# Patient Record
Sex: Male | Born: 1935 | Race: White | Hispanic: No | Marital: Married
Health system: Southern US, Community
[De-identification: ages and names within clinical notes are randomized; demographics above are authoritative.]

---

## 2005-01-11 ENCOUNTER — Emergency Department: Payer: Self-pay | Admitting: Emergency Medicine

## 2006-09-20 ENCOUNTER — Encounter: Payer: Self-pay | Admitting: Family Medicine

## 2006-10-08 ENCOUNTER — Encounter: Payer: Self-pay | Admitting: Family Medicine

## 2009-02-17 ENCOUNTER — Ambulatory Visit: Payer: Self-pay | Admitting: Urology

## 2009-10-28 ENCOUNTER — Encounter: Payer: Self-pay | Admitting: Family Medicine

## 2009-11-06 ENCOUNTER — Encounter: Payer: Self-pay | Admitting: Family Medicine

## 2009-12-10 ENCOUNTER — Encounter: Payer: Self-pay | Admitting: Family Medicine

## 2010-01-05 ENCOUNTER — Inpatient Hospital Stay: Payer: Self-pay | Admitting: Surgery

## 2010-01-13 ENCOUNTER — Ambulatory Visit: Payer: Self-pay | Admitting: Internal Medicine

## 2010-01-14 ENCOUNTER — Encounter: Payer: Self-pay | Admitting: Family Medicine

## 2010-01-20 ENCOUNTER — Encounter: Payer: Self-pay | Admitting: Internal Medicine

## 2010-02-06 ENCOUNTER — Encounter: Payer: Self-pay | Admitting: Internal Medicine

## 2010-04-26 ENCOUNTER — Emergency Department: Payer: Self-pay | Admitting: Unknown Physician Specialty

## 2010-08-02 ENCOUNTER — Emergency Department: Payer: Self-pay | Admitting: Emergency Medicine

## 2010-10-17 ENCOUNTER — Emergency Department: Payer: Self-pay | Admitting: Emergency Medicine

## 2011-05-12 ENCOUNTER — Emergency Department: Payer: Self-pay | Admitting: Emergency Medicine

## 2011-05-12 LAB — CBC
HCT: 38.4 % — ABNORMAL LOW (ref 40.0–52.0)
HGB: 12.7 g/dL — ABNORMAL LOW (ref 13.0–18.0)
MCH: 30.6 pg (ref 26.0–34.0)
MCHC: 33.1 g/dL (ref 32.0–36.0)
WBC: 6.8 10*3/uL (ref 3.8–10.6)

## 2011-05-12 LAB — COMPREHENSIVE METABOLIC PANEL
Alkaline Phosphatase: 64 U/L (ref 50–136)
Anion Gap: 12 (ref 7–16)
Bilirubin,Total: 0.6 mg/dL (ref 0.2–1.0)
Calcium, Total: 9 mg/dL (ref 8.5–10.1)
Chloride: 107 mmol/L (ref 98–107)
Co2: 28 mmol/L (ref 21–32)
Creatinine: 0.52 mg/dL — ABNORMAL LOW (ref 0.60–1.30)
EGFR (African American): >60
EGFR (Non-African Amer.): >60
Osmolality: 291 (ref 275–301)
Sodium: 147 mmol/L — ABNORMAL HIGH (ref 136–145)

## 2011-05-12 LAB — URINALYSIS, COMPLETE
Bilirubin,UR: NEGATIVE
Glucose,UR: NEGATIVE mg/dL (ref 0–75)
RBC,UR: 25 /HPF (ref 0–5)
Specific Gravity: 1.014 (ref 1.003–1.030)
Squamous Epithelial: 10
WBC UR: 414 /HPF (ref 0–5)

## 2011-07-19 ENCOUNTER — Inpatient Hospital Stay: Payer: Self-pay | Admitting: Internal Medicine

## 2011-07-19 LAB — CBC WITH DIFFERENTIAL/PLATELET
Basophil %: 0.6 %
Eosinophil #: 0.1 10*3/uL (ref 0.0–0.7)
Eosinophil %: 1 %
HGB: 11.9 g/dL — ABNORMAL LOW (ref 13.0–18.0)
Lymphocyte #: 0.6 10*3/uL — ABNORMAL LOW (ref 1.0–3.6)
MCH: 32 pg (ref 26.0–34.0)
MCHC: 33.4 g/dL (ref 32.0–36.0)
Monocyte #: 1 10*3/uL — ABNORMAL HIGH (ref 0.0–0.7)
Neutrophil #: 5.4 10*3/uL (ref 1.4–6.5)
Neutrophil %: 75.6 %

## 2011-07-19 LAB — BASIC METABOLIC PANEL
Anion Gap: 12 (ref 7–16)
BUN: 13 mg/dL (ref 7–18)
Calcium, Total: 8.9 mg/dL (ref 8.5–10.1)
Chloride: 102 mmol/L (ref 98–107)
Co2: 24 mmol/L (ref 21–32)
Creatinine: 0.5 mg/dL — ABNORMAL LOW (ref 0.60–1.30)
EGFR (African American): >60
Glucose: 108 mg/dL — ABNORMAL HIGH (ref 65–99)
Potassium: 3.7 mmol/L (ref 3.5–5.1)
Sodium: 138 mmol/L (ref 136–145)

## 2011-07-20 ENCOUNTER — Ambulatory Visit: Payer: Self-pay | Admitting: Internal Medicine

## 2011-07-20 DIAGNOSIS — I959 Hypotension, unspecified: Secondary | ICD-10-CM

## 2011-07-20 DIAGNOSIS — R748 Abnormal levels of other serum enzymes: Secondary | ICD-10-CM

## 2011-07-20 LAB — URINALYSIS, COMPLETE
Glucose,UR: NEGATIVE mg/dL (ref 0–75)
Nitrite: NEGATIVE
Protein: 30
RBC,UR: 15 /HPF (ref 0–5)
Specific Gravity: 1.019 (ref 1.003–1.030)
WBC UR: 91 /HPF (ref 0–5)

## 2011-07-20 LAB — TSH: Thyroid Stimulating Horm: 0.287 u[IU]/mL — ABNORMAL LOW

## 2011-07-20 LAB — BASIC METABOLIC PANEL
Anion Gap: 12 (ref 7–16)
Calcium, Total: 8.3 mg/dL — ABNORMAL LOW (ref 8.5–10.1)
Chloride: 106 mmol/L (ref 98–107)
Co2: 25 mmol/L (ref 21–32)
Creatinine: 0.55 mg/dL — ABNORMAL LOW (ref 0.60–1.30)
EGFR (African American): >60
EGFR (Non-African Amer.): >60
Osmolality: 285 (ref 275–301)

## 2011-07-20 LAB — TROPONIN I
Troponin-I: 0.09 ng/mL — ABNORMAL HIGH
Troponin-I: 0.09 ng/mL — ABNORMAL HIGH

## 2011-07-20 LAB — CBC WITH DIFFERENTIAL/PLATELET
Basophil #: 0 10*3/uL (ref 0.0–0.1)
Lymphocyte #: 1.5 10*3/uL (ref 1.0–3.6)
Lymphocyte %: 13.2 %
Monocyte %: 10.7 %
Neutrophil %: 75 %
Platelet: 143 10*3/uL — ABNORMAL LOW (ref 150–440)
RDW: 15.4 % — ABNORMAL HIGH (ref 11.5–14.5)
WBC: 11.6 10*3/uL — ABNORMAL HIGH (ref 3.8–10.6)

## 2011-07-22 LAB — WOUND AEROBIC CULTURE

## 2011-07-22 LAB — URINE CULTURE

## 2011-07-24 LAB — CREATININE, SERUM
Creatinine: 0.56 mg/dL — ABNORMAL LOW (ref 0.60–1.30)
EGFR (African American): >60
EGFR (Non-African Amer.): >60

## 2011-07-25 LAB — CULTURE, BLOOD (SINGLE)

## 2011-07-26 LAB — CBC WITH DIFFERENTIAL/PLATELET
Basophil %: 0.6 %
Eosinophil #: 0.7 10*3/uL (ref 0.0–0.7)
Eosinophil %: 13.1 %
HCT: 30.6 % — ABNORMAL LOW (ref 40.0–52.0)
HGB: 10 g/dL — ABNORMAL LOW (ref 13.0–18.0)
Lymphocyte #: 1.5 10*3/uL (ref 1.0–3.6)
Lymphocyte %: 27.8 %
MCH: 31.5 pg (ref 26.0–34.0)
MCHC: 32.7 g/dL (ref 32.0–36.0)
Monocyte %: 12.2 %
Neutrophil #: 2.5 10*3/uL (ref 1.4–6.5)
WBC: 5.5 10*3/uL (ref 3.8–10.6)

## 2011-07-27 LAB — OCCULT BLOOD X 1 CARD TO LAB, STOOL: Occult Blood, Feces: NEGATIVE

## 2011-07-27 LAB — TSH: Thyroid Stimulating Horm: 1.86 u[IU]/mL

## 2011-07-27 LAB — T4, FREE: Free Thyroxine: 0.95 ng/dL (ref 0.76–1.46)

## 2011-07-28 LAB — CREATININE, SERUM: EGFR (Non-African Amer.): >60

## 2011-08-05 ENCOUNTER — Inpatient Hospital Stay: Payer: Self-pay | Admitting: Internal Medicine

## 2011-08-05 LAB — COMPREHENSIVE METABOLIC PANEL
Alkaline Phosphatase: 57 U/L (ref 50–136)
Anion Gap: 7 (ref 7–16)
Bilirubin,Total: 0.3 mg/dL (ref 0.2–1.0)
Calcium, Total: 8.7 mg/dL (ref 8.5–10.1)
Chloride: 105 mmol/L (ref 98–107)
Creatinine: 0.68 mg/dL (ref 0.60–1.30)
EGFR (African American): >60
EGFR (Non-African Amer.): >60
Glucose: 94 mg/dL (ref 65–99)
Potassium: 4.1 mmol/L (ref 3.5–5.1)
SGOT(AST): 16 U/L (ref 15–37)

## 2011-08-05 LAB — CBC
HCT: 32.2 % — ABNORMAL LOW (ref 40.0–52.0)
MCH: 31.5 pg (ref 26.0–34.0)
MCHC: 33 g/dL (ref 32.0–36.0)
RBC: 3.37 10*6/uL — ABNORMAL LOW (ref 4.40–5.90)
WBC: 7.5 10*3/uL (ref 3.8–10.6)

## 2011-08-05 LAB — URINALYSIS, COMPLETE
Glucose,UR: NEGATIVE mg/dL (ref 0–75)
Ketone: NEGATIVE
Nitrite: POSITIVE
Ph: 5 (ref 4.5–8.0)
Protein: 30
RBC,UR: 3320 /HPF (ref 0–5)
Squamous Epithelial: NONE SEEN
WBC UR: 54 /HPF (ref 0–5)

## 2011-08-06 ENCOUNTER — Ambulatory Visit: Payer: Self-pay | Admitting: Urology

## 2011-08-06 LAB — CBC WITH DIFFERENTIAL/PLATELET
Basophil #: 0.1 10*3/uL (ref 0.0–0.1)
Basophil %: 0.8 %
Eosinophil #: 0.8 10*3/uL — ABNORMAL HIGH (ref 0.0–0.7)
Eosinophil %: 10.7 %
HCT: 29.6 % — ABNORMAL LOW (ref 40.0–52.0)
Lymphocyte #: 1.7 10*3/uL (ref 1.0–3.6)
Lymphocyte %: 21.5 %
MCH: 31.7 pg (ref 26.0–34.0)
MCHC: 33.3 g/dL (ref 32.0–36.0)
Monocyte %: 10.8 %
RBC: 3.12 10*6/uL — ABNORMAL LOW (ref 4.40–5.90)
RDW: 14.6 % — ABNORMAL HIGH (ref 11.5–14.5)
WBC: 7.9 10*3/uL (ref 3.8–10.6)

## 2011-08-06 LAB — BASIC METABOLIC PANEL
Anion Gap: 8 (ref 7–16)
BUN: 17 mg/dL (ref 7–18)
Chloride: 111 mmol/L — ABNORMAL HIGH (ref 98–107)
Co2: 25 mmol/L (ref 21–32)
EGFR (Non-African Amer.): >60
Glucose: 102 mg/dL — ABNORMAL HIGH (ref 65–99)
Potassium: 3.8 mmol/L (ref 3.5–5.1)
Sodium: 144 mmol/L (ref 136–145)

## 2011-08-06 LAB — CK TOTAL AND CKMB (NOT AT ARMC)
CK, Total: 25 U/L — ABNORMAL LOW (ref 35–232)
CK-MB: 0.8 ng/mL (ref 0.5–3.6)

## 2011-08-06 LAB — TROPONIN I
Troponin-I: <0.02 ng/mL
Troponin-I: <0.02 ng/mL

## 2011-08-07 LAB — MAGNESIUM: Magnesium: 1.5 mg/dL — ABNORMAL LOW

## 2011-08-07 LAB — BASIC METABOLIC PANEL
BUN: 10 mg/dL (ref 7–18)
Calcium, Total: 8 mg/dL — ABNORMAL LOW (ref 8.5–10.1)
Co2: 26 mmol/L (ref 21–32)
Creatinine: 0.52 mg/dL — ABNORMAL LOW (ref 0.60–1.30)
Osmolality: 292 (ref 275–301)
Sodium: 147 mmol/L — ABNORMAL HIGH (ref 136–145)

## 2011-08-07 LAB — HEMOGLOBIN: HGB: 10.3 g/dL — ABNORMAL LOW (ref 13.0–18.0)

## 2011-08-08 ENCOUNTER — Ambulatory Visit: Payer: Self-pay | Admitting: Internal Medicine

## 2011-08-08 LAB — BASIC METABOLIC PANEL
Anion Gap: 8 (ref 7–16)
Calcium, Total: 8.2 mg/dL — ABNORMAL LOW (ref 8.5–10.1)
EGFR (African American): >60
Glucose: 127 mg/dL — ABNORMAL HIGH (ref 65–99)
Potassium: 4 mmol/L (ref 3.5–5.1)
Sodium: 144 mmol/L (ref 136–145)

## 2011-08-08 LAB — URINE CULTURE

## 2011-08-08 LAB — TSH: Thyroid Stimulating Horm: 3.95 u[IU]/mL

## 2011-08-08 LAB — MAGNESIUM: Magnesium: 1.9 mg/dL

## 2011-08-09 LAB — BASIC METABOLIC PANEL
Calcium, Total: 8.1 mg/dL — ABNORMAL LOW (ref 8.5–10.1)
Co2: 27 mmol/L (ref 21–32)
Creatinine: 0.55 mg/dL — ABNORMAL LOW (ref 0.60–1.30)
EGFR (Non-African Amer.): >60
Osmolality: 290 (ref 275–301)
Potassium: 4.1 mmol/L (ref 3.5–5.1)
Sodium: 146 mmol/L — ABNORMAL HIGH (ref 136–145)

## 2011-08-09 LAB — CBC WITH DIFFERENTIAL/PLATELET
Basophil #: 0.1 10*3/uL (ref 0.0–0.1)
Eosinophil %: 14.8 %
HGB: 10 g/dL — ABNORMAL LOW (ref 13.0–18.0)
Lymphocyte #: 1.9 10*3/uL (ref 1.0–3.6)
MCH: 31.6 pg (ref 26.0–34.0)
MCHC: 33.2 g/dL (ref 32.0–36.0)
MCV: 95 fL (ref 80–100)
Monocyte #: 0.9 10*3/uL — ABNORMAL HIGH (ref 0.0–0.7)
Monocyte %: 11.7 %
Neutrophil #: 3.5 10*3/uL (ref 1.4–6.5)
Platelet: 229 10*3/uL (ref 150–440)
RBC: 3.17 10*6/uL — ABNORMAL LOW (ref 4.40–5.90)
RDW: 15 % — ABNORMAL HIGH (ref 11.5–14.5)

## 2011-08-11 DIAGNOSIS — I059 Rheumatic mitral valve disease, unspecified: Secondary | ICD-10-CM

## 2011-08-11 DIAGNOSIS — I498 Other specified cardiac arrhythmias: Secondary | ICD-10-CM

## 2011-08-11 DIAGNOSIS — I959 Hypotension, unspecified: Secondary | ICD-10-CM

## 2011-08-11 LAB — BASIC METABOLIC PANEL
Anion Gap: 8 (ref 7–16)
BUN: 16 mg/dL (ref 7–18)
Calcium, Total: 8.9 mg/dL (ref 8.5–10.1)
Chloride: 109 mmol/L — ABNORMAL HIGH (ref 98–107)
Co2: 28 mmol/L (ref 21–32)
Creatinine: 0.47 mg/dL — ABNORMAL LOW (ref 0.60–1.30)
EGFR (Non-African Amer.): >60
Osmolality: 291 (ref 275–301)
Sodium: 145 mmol/L (ref 136–145)

## 2011-08-11 LAB — CULTURE, BLOOD (SINGLE)

## 2011-08-11 LAB — HEMOGLOBIN: HGB: 11.1 g/dL — ABNORMAL LOW (ref 13.0–18.0)

## 2011-08-13 LAB — URINALYSIS, COMPLETE
Bilirubin,UR: NEGATIVE
Glucose,UR: NEGATIVE mg/dL (ref 0–75)
Nitrite: NEGATIVE
Specific Gravity: 1.002 (ref 1.003–1.030)
Squamous Epithelial: NONE SEEN
WBC UR: 60 /HPF (ref 0–5)

## 2011-08-17 LAB — CBC WITH DIFFERENTIAL/PLATELET
Eosinophil %: 0.1 %
HGB: 9.5 g/dL — ABNORMAL LOW (ref 13.0–18.0)
Lymphocyte #: 0.8 10*3/uL — ABNORMAL LOW (ref 1.0–3.6)
MCH: 31.2 pg (ref 26.0–34.0)
MCHC: 32.6 g/dL (ref 32.0–36.0)
Monocyte #: 0.4 x10 3/mm (ref 0.2–1.0)
Monocyte %: 5 %
Neutrophil #: 6.4 10*3/uL (ref 1.4–6.5)
Neutrophil %: 84.4 %
Platelet: 164 10*3/uL (ref 150–440)
RBC: 3.06 10*6/uL — ABNORMAL LOW (ref 4.40–5.90)
WBC: 7.6 10*3/uL (ref 3.8–10.6)

## 2011-08-18 LAB — BASIC METABOLIC PANEL
Co2: 26 mmol/L (ref 21–32)
Creatinine: 0.57 mg/dL — ABNORMAL LOW (ref 0.60–1.30)
EGFR (African American): >60
EGFR (Non-African Amer.): >60
Glucose: 113 mg/dL — ABNORMAL HIGH (ref 65–99)
Potassium: 3.5 mmol/L (ref 3.5–5.1)

## 2011-08-19 LAB — BASIC METABOLIC PANEL
Anion Gap: 8 (ref 7–16)
Co2: 28 mmol/L (ref 21–32)
Creatinine: 0.5 mg/dL — ABNORMAL LOW (ref 0.60–1.30)
EGFR (African American): >60
EGFR (Non-African Amer.): >60
Potassium: 3.8 mmol/L (ref 3.5–5.1)
Sodium: 141 mmol/L (ref 136–145)

## 2011-10-23 ENCOUNTER — Emergency Department: Payer: Self-pay | Admitting: Emergency Medicine

## 2011-10-23 LAB — URINALYSIS, COMPLETE
Bilirubin,UR: NEGATIVE
Nitrite: NEGATIVE
Ph: 8 (ref 4.5–8.0)
Protein: 100
Specific Gravity: 1.009 (ref 1.003–1.030)
WBC UR: 1710 /HPF (ref 0–5)

## 2011-10-25 LAB — URINE CULTURE

## 2011-12-17 ENCOUNTER — Emergency Department: Payer: Self-pay | Admitting: Emergency Medicine

## 2011-12-17 LAB — COMPREHENSIVE METABOLIC PANEL
Albumin: 2 g/dL — ABNORMAL LOW (ref 3.4–5.0)
Alkaline Phosphatase: 64 U/L (ref 50–136)
BUN: 16 mg/dL (ref 7–18)
Bilirubin,Total: 0.2 mg/dL (ref 0.2–1.0)
Calcium, Total: 8.8 mg/dL (ref 8.5–10.1)
Creatinine: 0.75 mg/dL (ref 0.60–1.30)
Glucose: 152 mg/dL — ABNORMAL HIGH (ref 65–99)
Osmolality: 285 (ref 275–301)
Sodium: 141 mmol/L (ref 136–145)
Total Protein: 7 g/dL (ref 6.4–8.2)

## 2011-12-17 LAB — CBC
HCT: 30.8 % — ABNORMAL LOW (ref 40.0–52.0)
HGB: 10.3 g/dL — ABNORMAL LOW (ref 13.0–18.0)
MCH: 29.1 pg (ref 26.0–34.0)
MCV: 87 fL (ref 80–100)
Platelet: 341 10*3/uL (ref 150–440)
RBC: 3.54 10*6/uL — ABNORMAL LOW (ref 4.40–5.90)
WBC: 8.4 10*3/uL (ref 3.8–10.6)

## 2011-12-17 LAB — URINALYSIS, COMPLETE
Bilirubin,UR: NEGATIVE
Glucose,UR: NEGATIVE mg/dL (ref 0–75)
Ketone: NEGATIVE
RBC,UR: 1211 /HPF (ref 0–5)
Specific Gravity: 1.012 (ref 1.003–1.030)
Squamous Epithelial: NONE SEEN
WBC UR: 8841 /HPF (ref 0–5)

## 2011-12-18 LAB — URINE CULTURE

## 2011-12-22 LAB — CULTURE, BLOOD (SINGLE)

## 2012-01-11 ENCOUNTER — Inpatient Hospital Stay: Payer: Self-pay | Admitting: Internal Medicine

## 2012-01-11 LAB — URINALYSIS, COMPLETE
Bilirubin,UR: NEGATIVE
Ketone: NEGATIVE
Nitrite: NEGATIVE
Ph: 5 (ref 4.5–8.0)
Protein: 30
RBC,UR: 729 /HPF (ref 0–5)
Specific Gravity: 1.02 (ref 1.003–1.030)
Squamous Epithelial: NONE SEEN
WBC UR: 11343 /HPF (ref 0–5)

## 2012-01-11 LAB — COMPREHENSIVE METABOLIC PANEL
Albumin: 1.6 g/dL — ABNORMAL LOW (ref 3.4–5.0)
BUN: 22 mg/dL — ABNORMAL HIGH (ref 7–18)
Bilirubin,Total: 0.3 mg/dL (ref 0.2–1.0)
Calcium, Total: 8.6 mg/dL (ref 8.5–10.1)
Creatinine: 0.65 mg/dL (ref 0.60–1.30)
EGFR (African American): >60
EGFR (Non-African Amer.): >60
Glucose: 102 mg/dL — ABNORMAL HIGH (ref 65–99)
Osmolality: 277 (ref 275–301)
Potassium: 3.4 mmol/L — ABNORMAL LOW (ref 3.5–5.1)
SGOT(AST): 20 U/L (ref 15–37)
Sodium: 137 mmol/L (ref 136–145)
Total Protein: 7.1 g/dL (ref 6.4–8.2)

## 2012-01-11 LAB — CBC
HGB: 8.9 g/dL — ABNORMAL LOW (ref 13.0–18.0)
MCV: 83 fL (ref 80–100)
Platelet: 328 10*3/uL (ref 150–440)
RDW: 15.7 % — ABNORMAL HIGH (ref 11.5–14.5)
WBC: 8.6 10*3/uL (ref 3.8–10.6)

## 2012-01-13 LAB — CBC WITH DIFFERENTIAL/PLATELET
Basophil #: 0 10*3/uL (ref 0.0–0.1)
Basophil %: 0.6 %
Eosinophil %: 0.3 %
HCT: 24.4 % — ABNORMAL LOW (ref 40.0–52.0)
Lymphocyte #: 1 10*3/uL (ref 1.0–3.6)
Lymphocyte %: 19.1 %
MCH: 25.6 pg — ABNORMAL LOW (ref 26.0–34.0)
MCV: 83 fL (ref 80–100)
Monocyte #: 0.3 x10 3/mm (ref 0.2–1.0)
Monocyte %: 6.4 %
Platelet: 296 10*3/uL (ref 150–440)
RDW: 15.7 % — ABNORMAL HIGH (ref 11.5–14.5)
WBC: 5.2 10*3/uL (ref 3.8–10.6)

## 2012-01-13 LAB — BASIC METABOLIC PANEL
BUN: 18 mg/dL (ref 7–18)
Chloride: 102 mmol/L (ref 98–107)
Creatinine: 0.74 mg/dL (ref 0.60–1.30)
Osmolality: 277 (ref 275–301)
Potassium: 3.2 mmol/L — ABNORMAL LOW (ref 3.5–5.1)
Sodium: 136 mmol/L (ref 136–145)

## 2012-01-13 LAB — FERRITIN: Ferritin (ARMC): 819 ng/mL — ABNORMAL HIGH (ref 8–388)

## 2012-01-13 LAB — IRON AND TIBC
Iron Bind.Cap.(Total): 120 ug/dL — ABNORMAL LOW (ref 250–450)
Iron: 47 ug/dL — ABNORMAL LOW (ref 65–175)

## 2012-01-14 LAB — BASIC METABOLIC PANEL
Anion Gap: 8 (ref 7–16)
BUN: 16 mg/dL (ref 7–18)
Calcium, Total: 8.8 mg/dL (ref 8.5–10.1)
Creatinine: 0.73 mg/dL (ref 0.60–1.30)
EGFR (African American): >60
EGFR (Non-African Amer.): >60
Glucose: 138 mg/dL — ABNORMAL HIGH (ref 65–99)
Sodium: 135 mmol/L — ABNORMAL LOW (ref 136–145)

## 2012-01-14 LAB — CBC WITH DIFFERENTIAL/PLATELET
Basophil #: 0 10*3/uL (ref 0.0–0.1)
HCT: 25.6 % — ABNORMAL LOW (ref 40.0–52.0)
Lymphocyte %: 13.9 %
Monocyte #: 0.3 x10 3/mm (ref 0.2–1.0)
Monocyte %: 4.1 %
Neutrophil #: 5.8 10*3/uL (ref 1.4–6.5)
Platelet: 323 10*3/uL (ref 150–440)
RDW: 15.6 % — ABNORMAL HIGH (ref 11.5–14.5)
WBC: 7.1 10*3/uL (ref 3.8–10.6)

## 2012-01-17 LAB — CULTURE, BLOOD (SINGLE)

## 2012-01-23 LAB — URINALYSIS, COMPLETE
Bilirubin,UR: NEGATIVE
Ketone: NEGATIVE
Ph: 5 (ref 4.5–8.0)
Protein: 100
Specific Gravity: 1.02 (ref 1.003–1.030)
Squamous Epithelial: NONE SEEN

## 2012-01-23 LAB — CBC
HCT: 26.1 % — ABNORMAL LOW (ref 40.0–52.0)
MCH: 28 pg (ref 26.0–34.0)
MCV: 84 fL (ref 80–100)
RBC: 3.11 10*6/uL — ABNORMAL LOW (ref 4.40–5.90)
RDW: 17.1 % — ABNORMAL HIGH (ref 11.5–14.5)
WBC: 9.7 10*3/uL (ref 3.8–10.6)

## 2012-01-23 LAB — BASIC METABOLIC PANEL
BUN: 22 mg/dL — ABNORMAL HIGH (ref 7–18)
Calcium, Total: 8.5 mg/dL (ref 8.5–10.1)
Co2: 27 mmol/L (ref 21–32)
Creatinine: 0.61 mg/dL (ref 0.60–1.30)
EGFR (African American): >60
EGFR (Non-African Amer.): >60
Glucose: 120 mg/dL — ABNORMAL HIGH (ref 65–99)
Potassium: 3.7 mmol/L (ref 3.5–5.1)
Sodium: 138 mmol/L (ref 136–145)

## 2012-01-24 ENCOUNTER — Observation Stay: Payer: Self-pay | Admitting: Internal Medicine

## 2012-01-25 LAB — BASIC METABOLIC PANEL
Anion Gap: 7 (ref 7–16)
BUN: 19 mg/dL — ABNORMAL HIGH (ref 7–18)
Chloride: 105 mmol/L (ref 98–107)
Co2: 25 mmol/L (ref 21–32)
Creatinine: 0.67 mg/dL (ref 0.60–1.30)
EGFR (Non-African Amer.): >60
Glucose: 158 mg/dL — ABNORMAL HIGH (ref 65–99)
Osmolality: 279 (ref 275–301)

## 2012-01-29 LAB — URINE CULTURE

## 2012-02-11 ENCOUNTER — Inpatient Hospital Stay: Payer: Self-pay | Admitting: Internal Medicine

## 2012-02-11 LAB — CBC WITH DIFFERENTIAL/PLATELET
Basophil #: 0 10*3/uL (ref 0.0–0.1)
Basophil %: 0.2 %
Eosinophil #: 0 10*3/uL (ref 0.0–0.7)
Eosinophil %: 0.1 %
HCT: 37.6 % — ABNORMAL LOW (ref 40.0–52.0)
HGB: 12.4 g/dL — ABNORMAL LOW (ref 13.0–18.0)
Lymphocyte %: 4.4 %
MCH: 28.5 pg (ref 26.0–34.0)
MCHC: 32.9 g/dL (ref 32.0–36.0)
MCV: 87 fL (ref 80–100)
Monocyte %: 3.4 %
Neutrophil #: 12.3 10*3/uL — ABNORMAL HIGH (ref 1.4–6.5)
Neutrophil %: 91.9 %
Platelet: 233 10*3/uL (ref 150–440)
RDW: 24.1 % — ABNORMAL HIGH (ref 11.5–14.5)
WBC: 13.4 10*3/uL — ABNORMAL HIGH (ref 3.8–10.6)

## 2012-02-11 LAB — COMPREHENSIVE METABOLIC PANEL
Albumin: 2.1 g/dL — ABNORMAL LOW (ref 3.4–5.0)
Anion Gap: 12 (ref 7–16)
BUN: 22 mg/dL — ABNORMAL HIGH (ref 7–18)
Bilirubin,Total: 0.5 mg/dL (ref 0.2–1.0)
Chloride: 103 mmol/L (ref 98–107)
Co2: 29 mmol/L (ref 21–32)
EGFR (African American): >60
EGFR (Non-African Amer.): >60
Glucose: 138 mg/dL — ABNORMAL HIGH (ref 65–99)
Potassium: 3.4 mmol/L — ABNORMAL LOW (ref 3.5–5.1)
SGOT(AST): 35 U/L (ref 15–37)
SGPT (ALT): 45 U/L (ref 12–78)
Sodium: 144 mmol/L (ref 136–145)
Total Protein: 5.9 g/dL — ABNORMAL LOW (ref 6.4–8.2)

## 2012-02-11 LAB — URINALYSIS, COMPLETE
Bilirubin,UR: NEGATIVE
Glucose,UR: NEGATIVE mg/dL (ref 0–75)
Ketone: NEGATIVE
Nitrite: NEGATIVE
Ph: 8 (ref 4.5–8.0)
Squamous Epithelial: NONE SEEN
WBC UR: 1181 /HPF (ref 0–5)

## 2012-02-11 LAB — PRO B NATRIURETIC PEPTIDE: B-Type Natriuretic Peptide: 5112 pg/mL — ABNORMAL HIGH (ref 0–450)

## 2012-02-11 LAB — LIPASE, BLOOD: Lipase: 69 U/L — ABNORMAL LOW (ref 73–393)

## 2012-02-11 LAB — APTT: Activated PTT: <23 secs — ABNORMAL LOW (ref 23.6–35.9)

## 2012-02-11 LAB — PROTIME-INR
INR: 1
Prothrombin Time: 13.6 secs (ref 11.5–14.7)

## 2012-02-11 LAB — MAGNESIUM: Magnesium: 1.7 mg/dL — ABNORMAL LOW

## 2012-02-11 LAB — TROPONIN I: Troponin-I: 0.18 ng/mL — ABNORMAL HIGH

## 2012-02-12 LAB — CBC WITH DIFFERENTIAL/PLATELET
Basophil #: 0 10*3/uL (ref 0.0–0.1)
Basophil %: 0.1 %
Eosinophil %: 0.6 %
HCT: 31.4 % — ABNORMAL LOW (ref 40.0–52.0)
HGB: 10.4 g/dL — ABNORMAL LOW (ref 13.0–18.0)
Lymphocyte #: 1.3 10*3/uL (ref 1.0–3.6)
Lymphocyte %: 14.1 %
MCH: 28.8 pg (ref 26.0–34.0)
MCV: 87 fL (ref 80–100)
Monocyte #: 0.4 x10 3/mm (ref 0.2–1.0)
Monocyte %: 4.4 %
Neutrophil #: 7.2 10*3/uL — ABNORMAL HIGH (ref 1.4–6.5)
RDW: 24.6 % — ABNORMAL HIGH (ref 11.5–14.5)
WBC: 8.9 10*3/uL (ref 3.8–10.6)

## 2012-02-12 LAB — BASIC METABOLIC PANEL
Anion Gap: 9 (ref 7–16)
Chloride: 106 mmol/L (ref 98–107)
Co2: 33 mmol/L — ABNORMAL HIGH (ref 21–32)
Creatinine: 0.44 mg/dL — ABNORMAL LOW (ref 0.60–1.30)
EGFR (African American): >60
EGFR (Non-African Amer.): >60
Osmolality: 295 (ref 275–301)
Potassium: 3.3 mmol/L — ABNORMAL LOW (ref 3.5–5.1)

## 2012-02-13 LAB — MAGNESIUM: Magnesium: 2 mg/dL

## 2012-02-17 LAB — CULTURE, BLOOD (SINGLE)

## 2012-02-17 LAB — URINE CULTURE

## 2012-03-09 DEATH — deceased

## 2012-10-16 IMAGING — CR DG CHEST 1V PORT
1 series · 1 of 1 positions shown · non-contrast
Comparison: none

REASON FOR EXAM: hypotension
COMMENTS:

[portable]
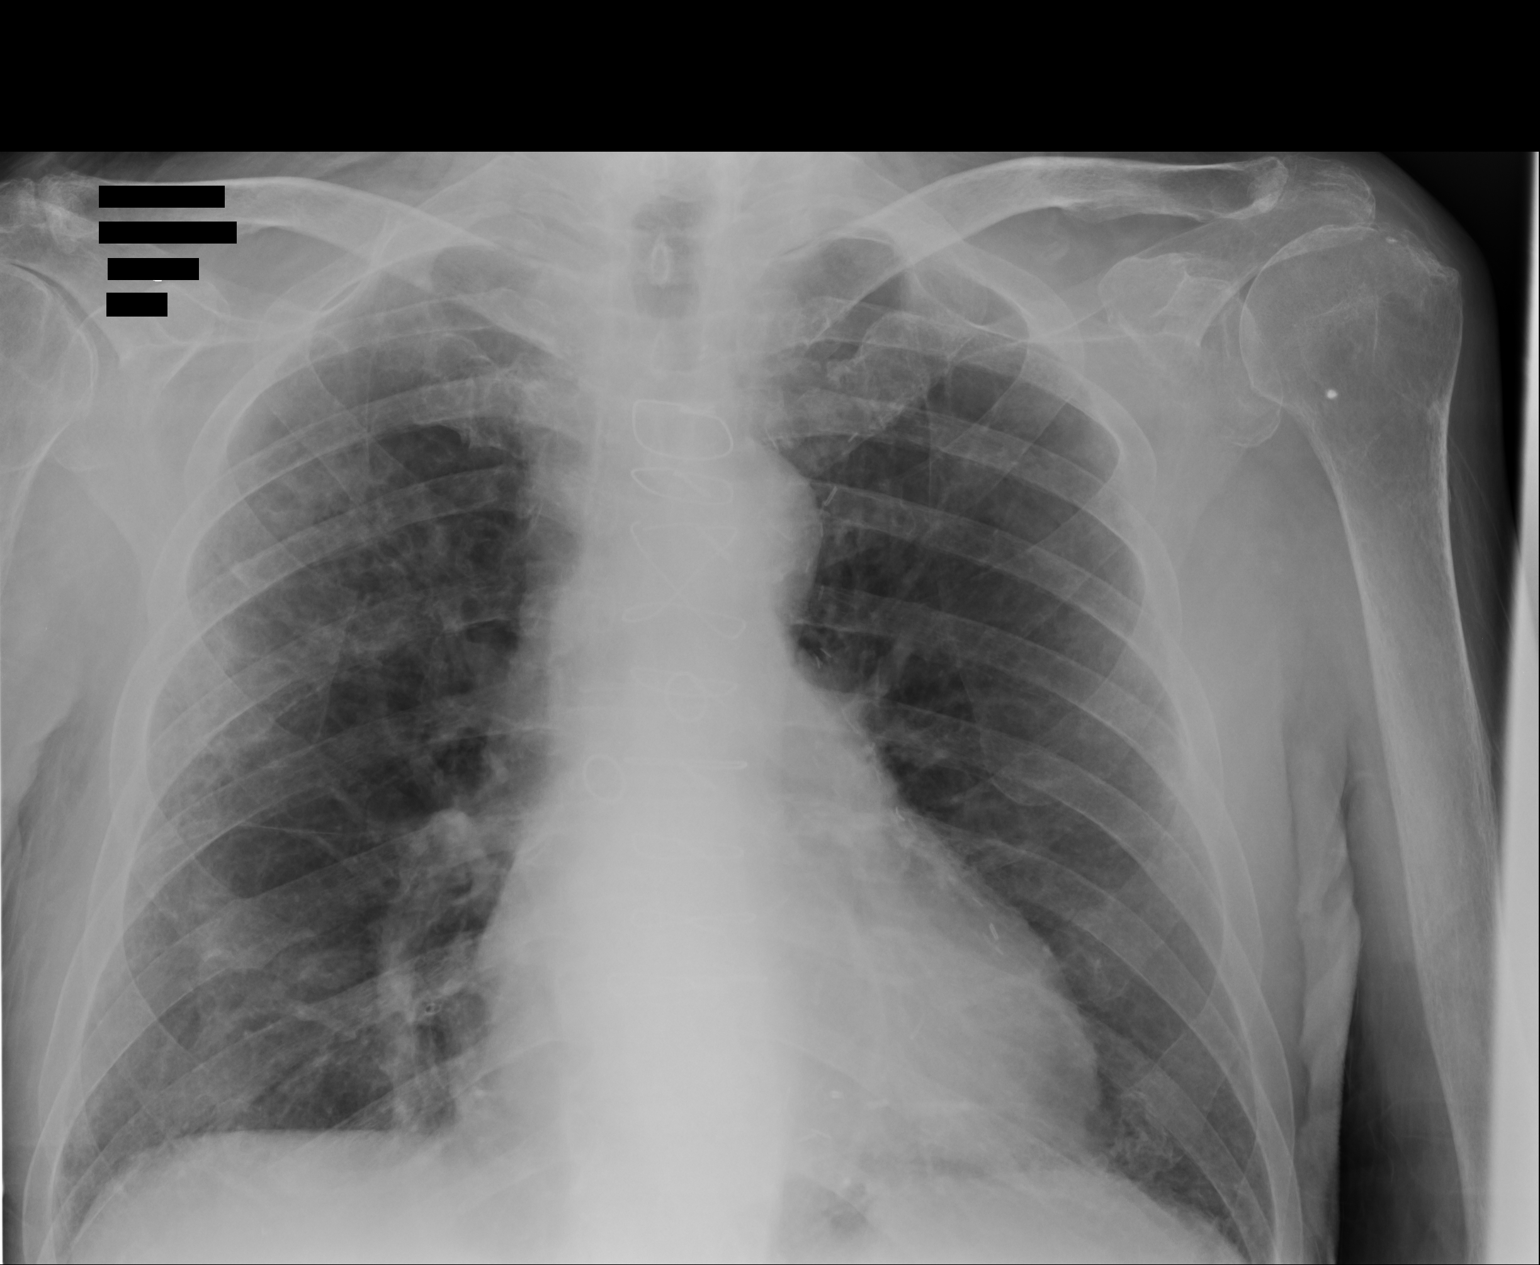

[1 of 1 positions shown; findings below may reference images not displayed]

PROCEDURE:     DXR - DXR PORTABLE CHEST SINGLE VIEW  - August 05, 2011  [DATE]

RESULT:     Comparison is made to the study 05 January, 2010.

Patchy density in the right upper lobes is consistent with pneumonia.
Minimal right lung base involvement is not excluded. The heart is normal.
The left lung is clear. There is mild hyperinflation. There is no
pneumothorax. No significant effusion is evident.
IMPRESSION: 1. Right upper lobe pneumonia with minimal right lung base involvement.
Follow up to exclude progression and to document complete resolution is
recommended.

## 2013-03-24 IMAGING — CR DG CHEST 2V
1 series · 3 of 3 positions shown · non-contrast
Comparison: none

REASON FOR EXAM: weakness
COMMENTS:

[Series 1: x chest ap · 0.14mm/px · 3 of 3 slices shown]
[im 1/3]
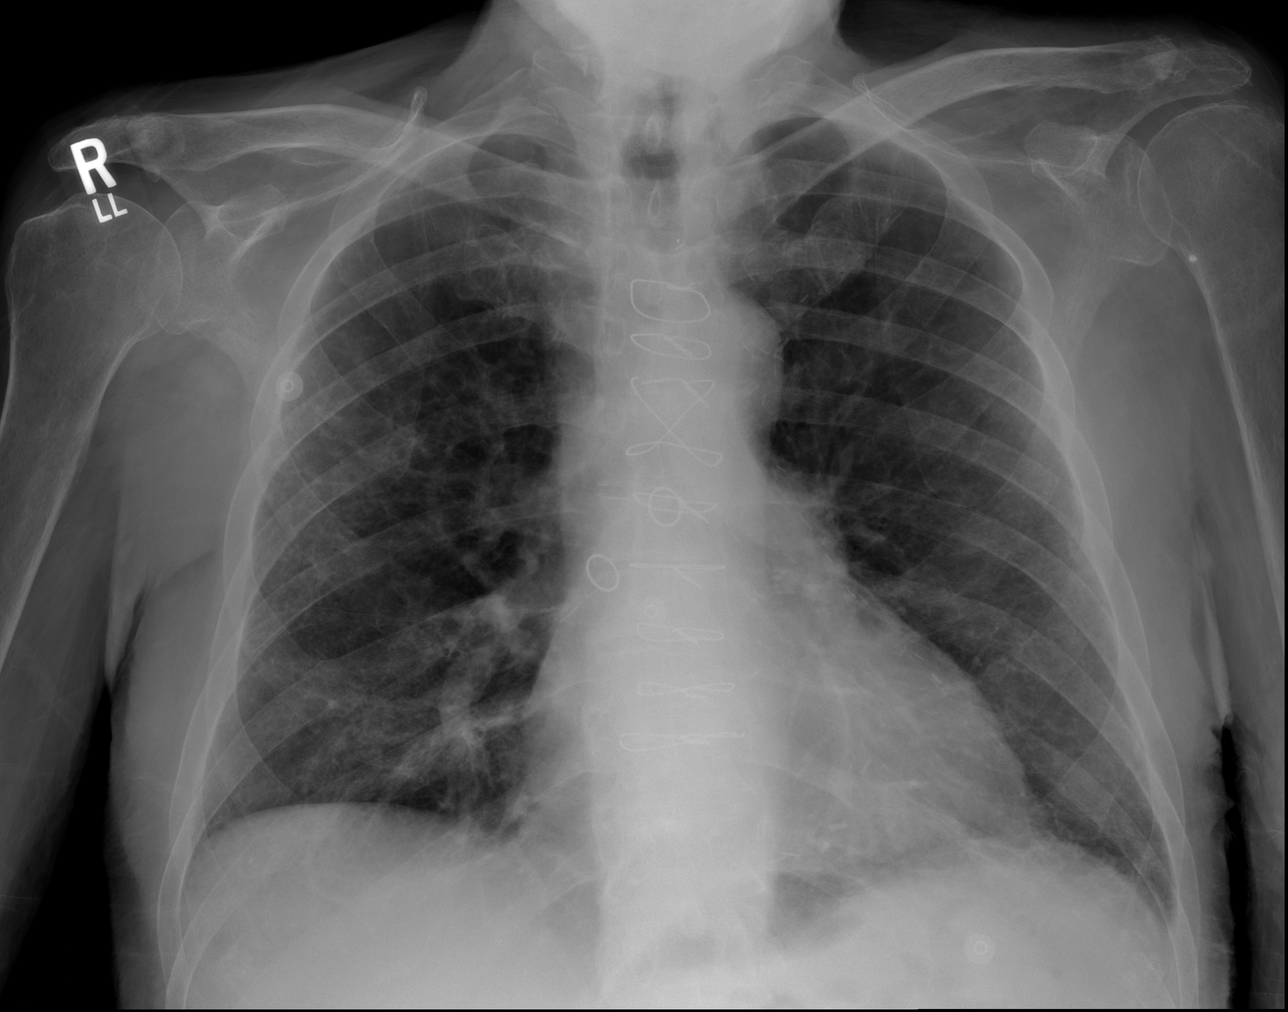
[im 2/3]
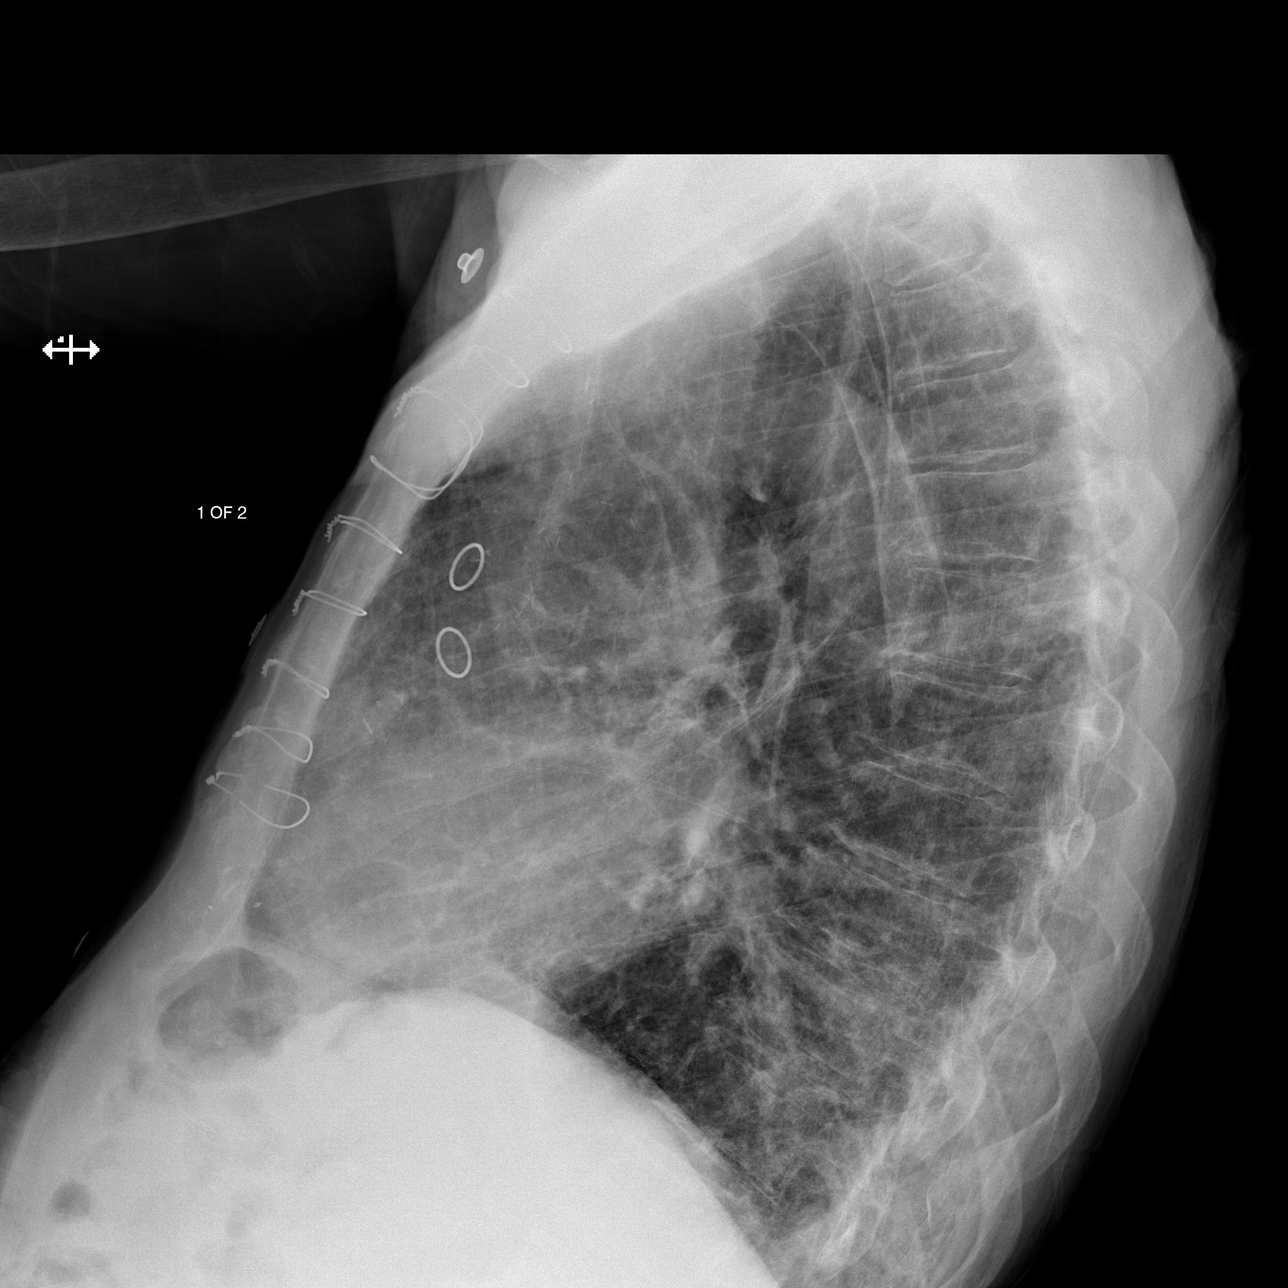
[im 3/3]
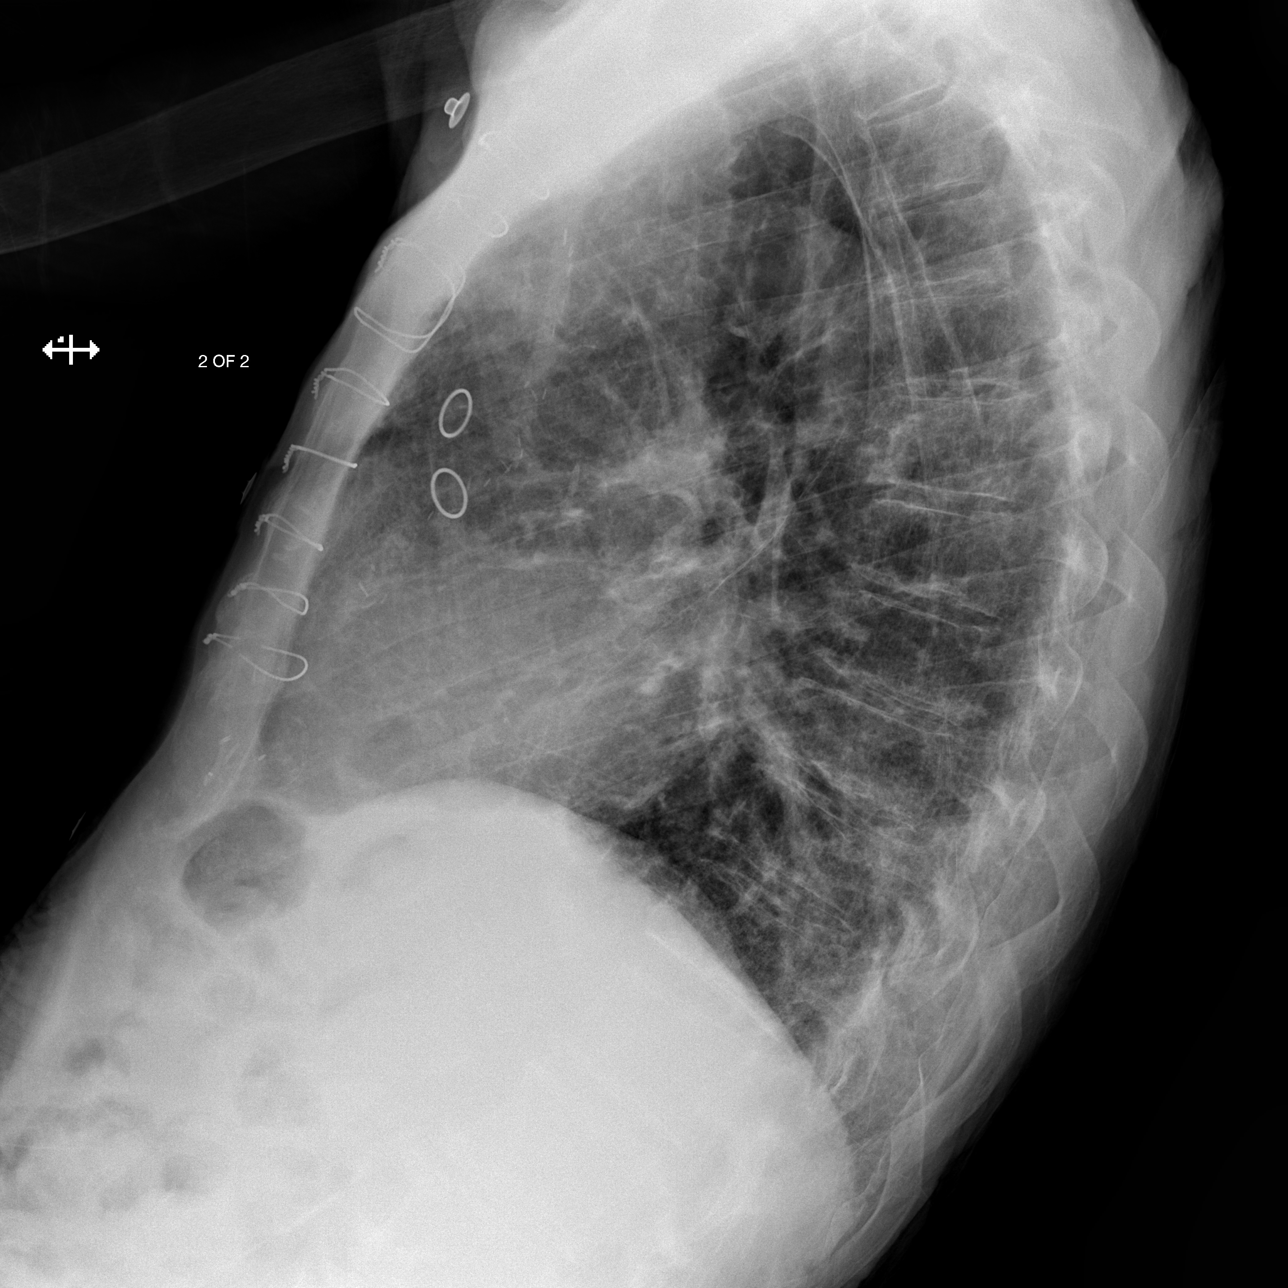

[3 of 3 positions shown; findings below may reference images not displayed]

PROCEDURE:     DXR - DXR CHEST PA (OR AP) AND LATERAL  - January 11, 2012  [DATE]

RESULT:     Comparison is made to the portable study 07 August, 2011.

The lungs are adequately inflated. There is no focal infiltrate. The
interstitial markings of both lungs are increased and have become more
conspicuous since the earlier study. Confluent density in the retrocardiac
region may the bilateral. The patient has undergone previous CABG. The
pulmonary vascularity is not clearly engorged.
IMPRESSION: There are increased interstitial markings in both lungs
especially inferiorly on the left but fairly diffusely on the right. These
findings are not entirely new. I cannot exclude superimposed subsegmental
atelectasis or mild interstitial edema with there being underlying chronic
pulmonary fibrotic change. If the patient's symptoms warrant further
evaluation, followup chest CT scanning would be a useful next step.

[REDACTED]

## 2013-04-24 IMAGING — CT CT HEAD WITHOUT CONTRAST
3 of 4 series · 18 of 30 positions shown, 20 images · non-contrast
Comparison: none

REASON FOR EXAM: seizure
COMMENTS:

PROCEDURE:     CT  - CT HEAD WITHOUT CONTRAST  - February 11, 2012  [DATE]
RESULT:     History: Seizure.
Comparison Study: Prior CT of 07/19/2011.

[Series 2: without · axial · non-contrast · 0.43mm/px · z∈[+172,+282]mm · 7 of 30 slices shown, 9 images (1 of 2)]
[im 4/30  brain]
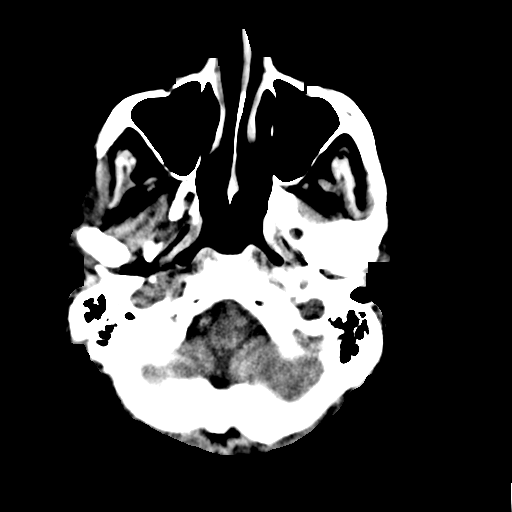
[im 4/30  bone]
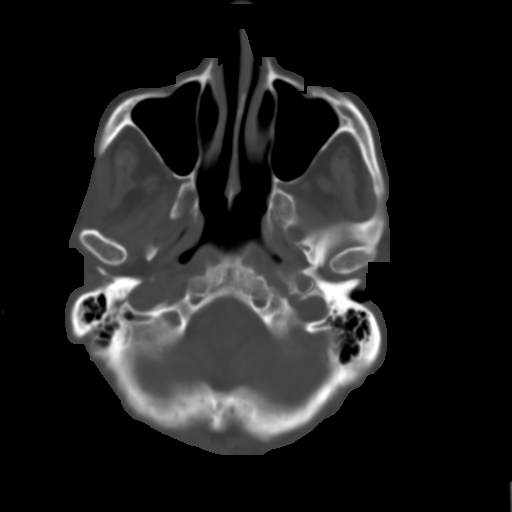
[im 8/30  brain]
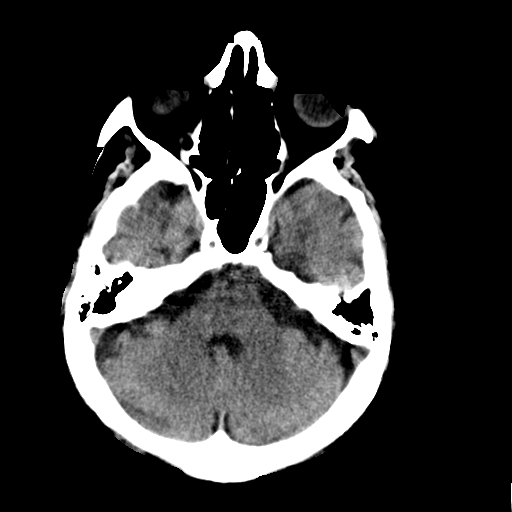
[im 11/30  brain]
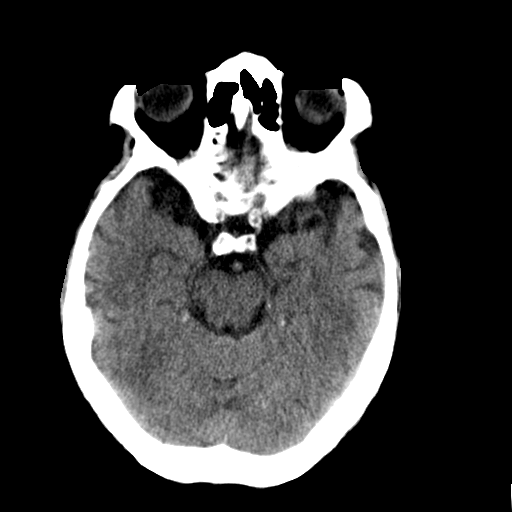
[im 15/30  brain]
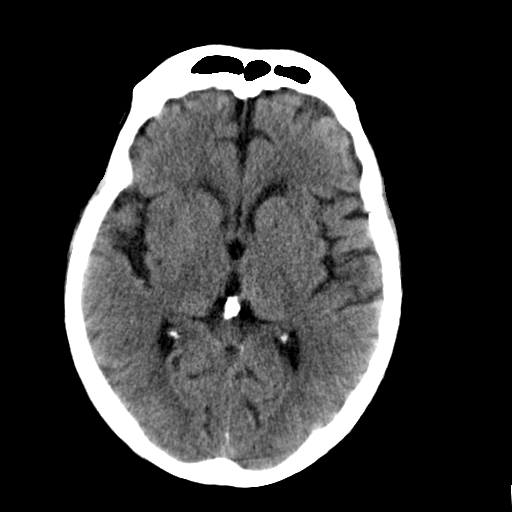
[im 19/30  brain]
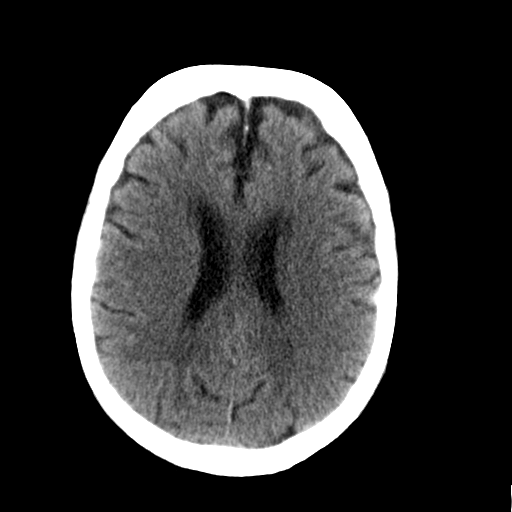
[im 19/30  bone]
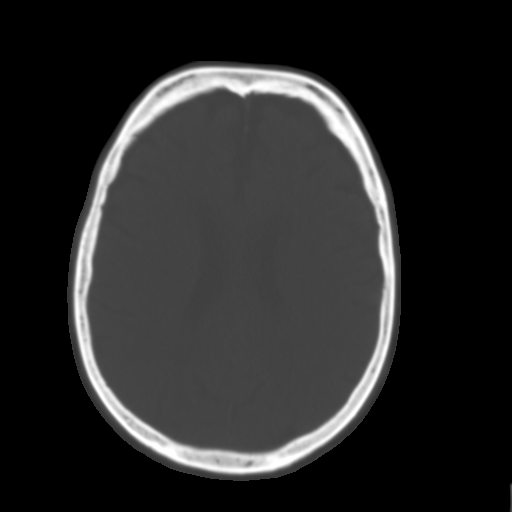
[im 22/30  brain]
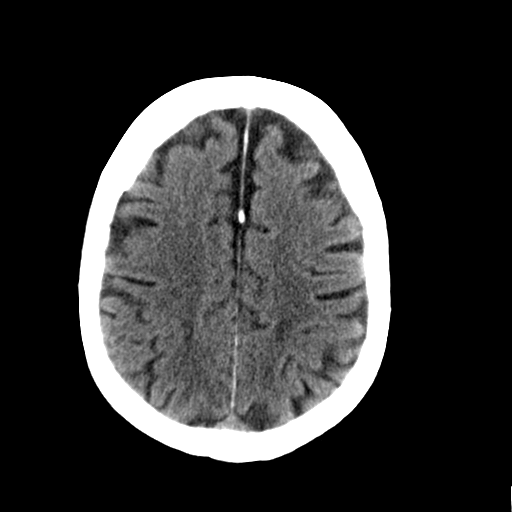
[im 26/30  brain]
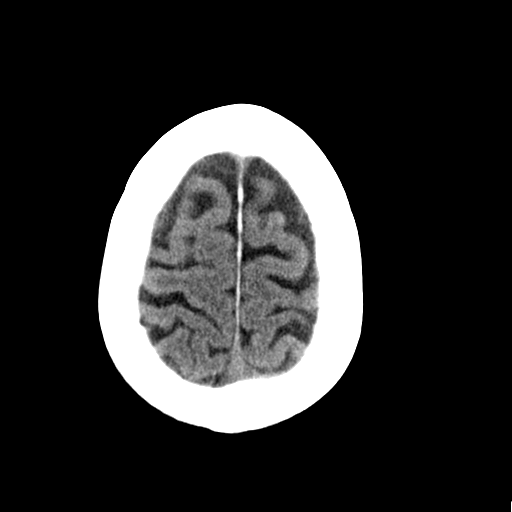

[Series 3: bone · axial · 0.43mm/px · z∈[+172,+282]mm · 7 of 30 slices shown]
[im 4/30  bone]
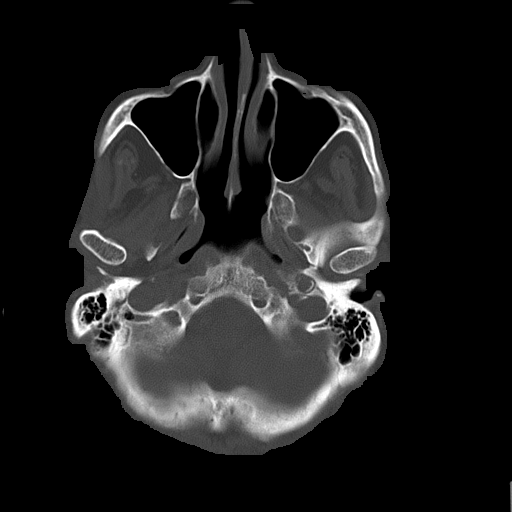
[im 8/30  bone]
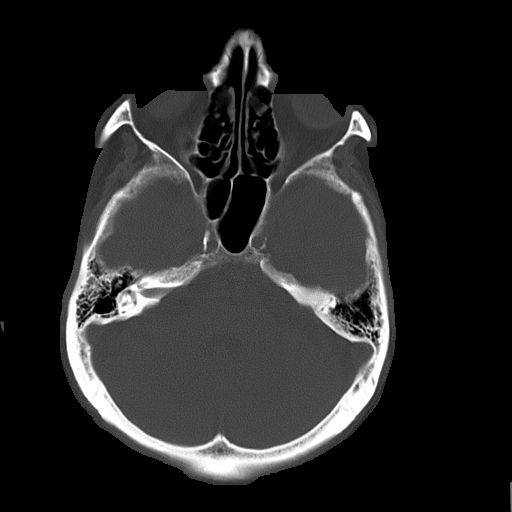
[im 11/30  bone]
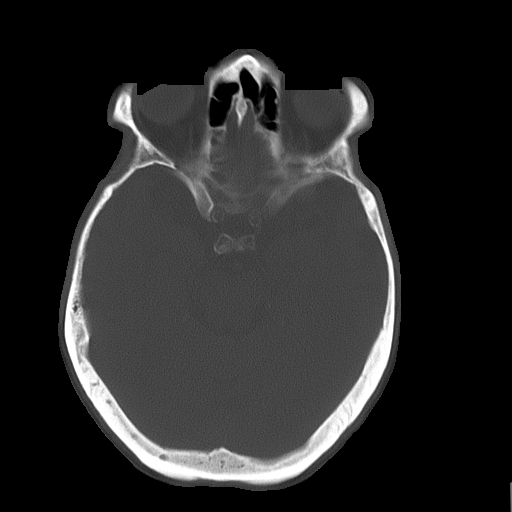
[im 15/30  bone]
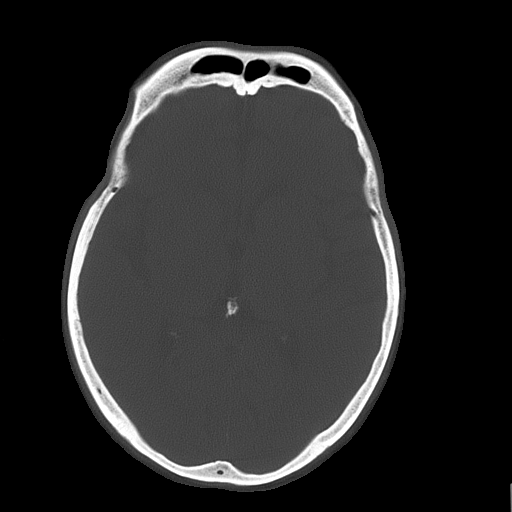
[im 19/30  bone]
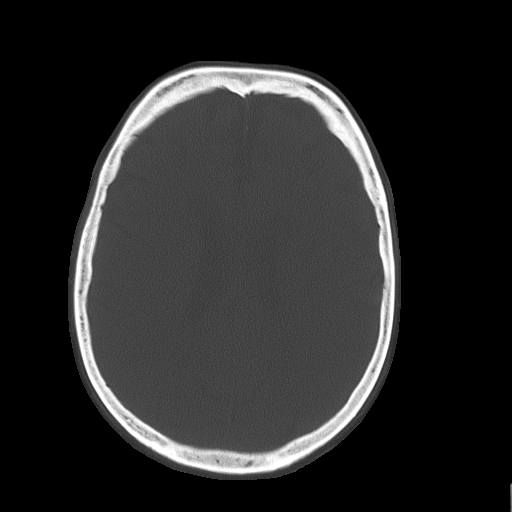
[im 22/30  bone]
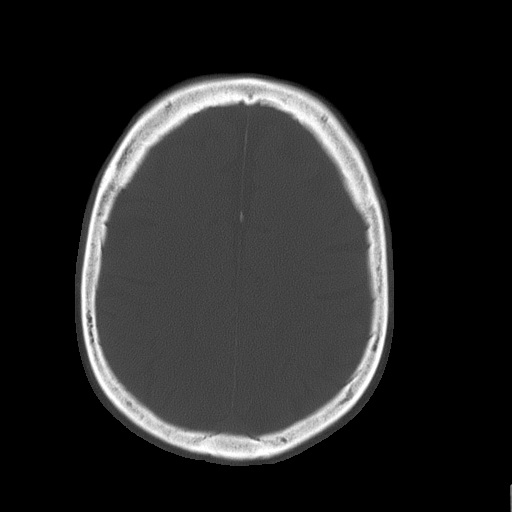
[im 26/30  bone]
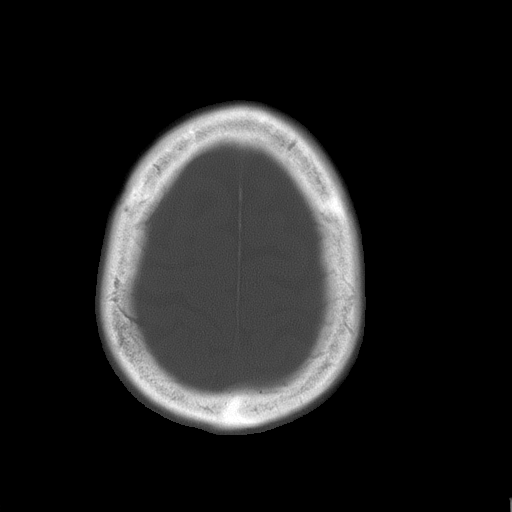

[Series 4: without · axial · non-contrast · 0.43mm/px · z∈[+203,+253]mm · 4 of 18 slices shown (2 of 2)]
[im 4/18  brain]
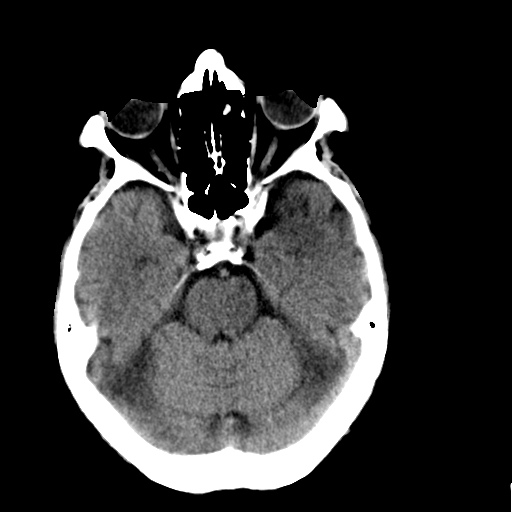
[im 7/18  brain]
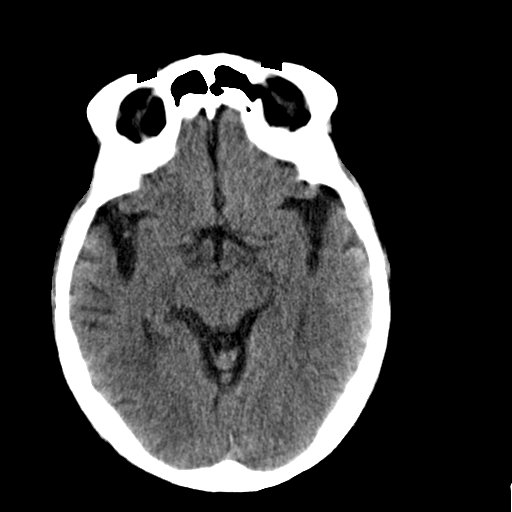
[im 11/18  brain]
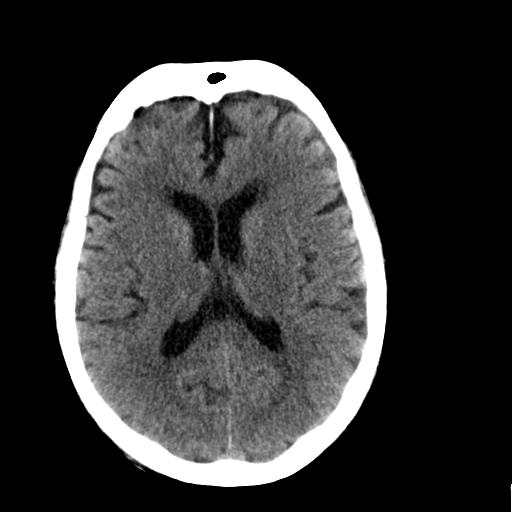
[im 14/18  brain]
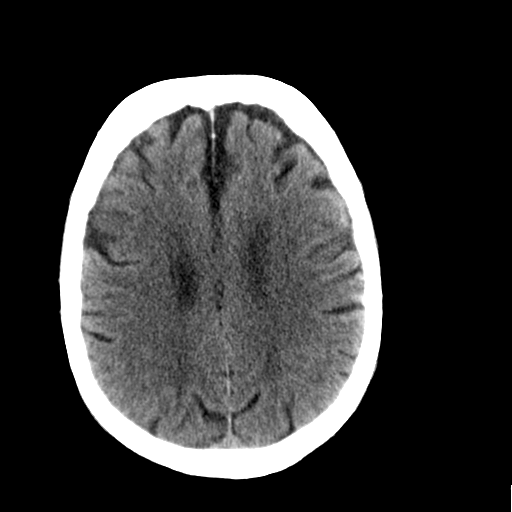

[18 of 30 positions shown; findings below may reference images not displayed]

FINDINGS: No mass. No hydrocephalus. No hemorrhage. No acute bony
abnormality.
IMPRESSION: No acute abnormality.

## 2013-04-24 IMAGING — CT CT ABD-PELV W/O CM
1 of 2 series · 15 of 32 positions shown, 19 images · non-contrast
Comparison: none

REASON FOR EXAM: (1) abd pain; (2) abd pain
COMMENTS:

PROCEDURE:     CT  - CT ABDOMEN AND PELVIS W[DATE]  [DATE]
RESULT:     A history: Pain.

[Series 2: 3mm soft tissue · axial · 0.77mm/px · z∈[-586,-124]mm · 15 of 168 slices shown, 19 images]
[im 7/168  soft-tissue]
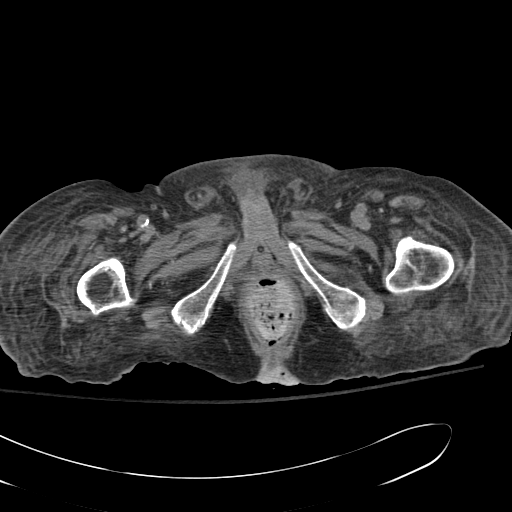
[im 7/168  bone]
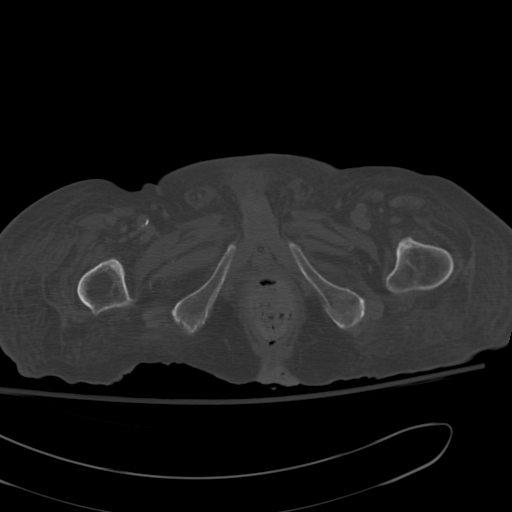
[im 21/168  soft-tissue]
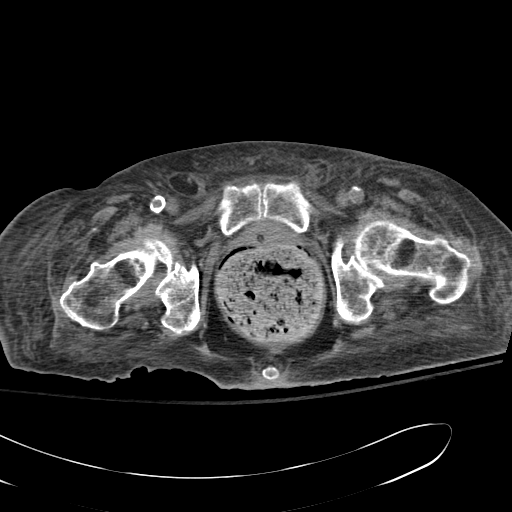
[im 34/168  soft-tissue]
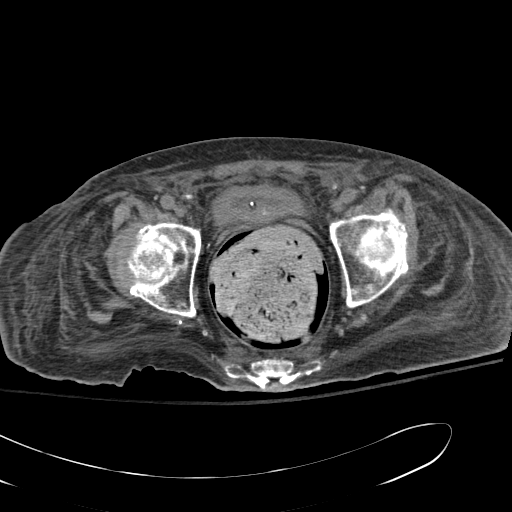
[im 47/168  soft-tissue]
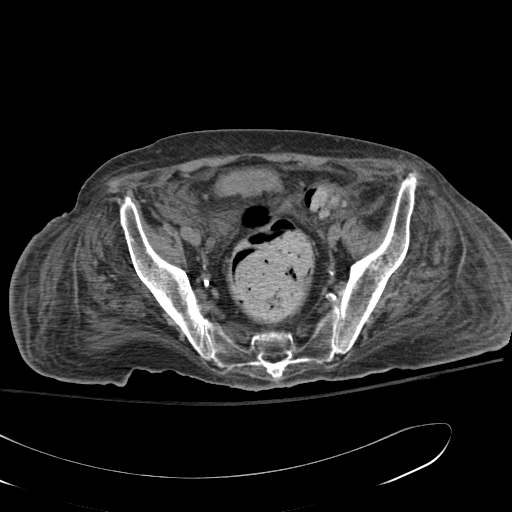
[im 61/168  soft-tissue]
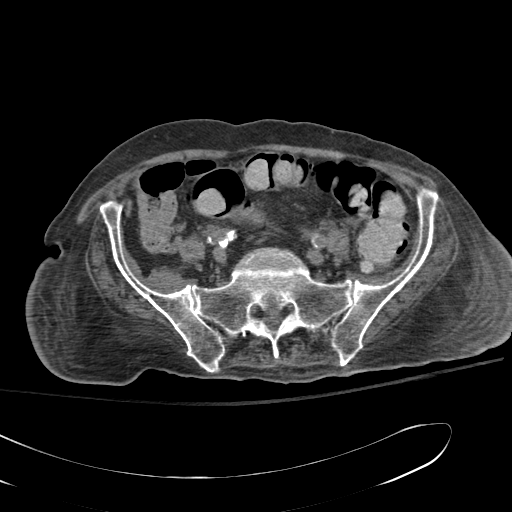
[im 74/168  soft-tissue]
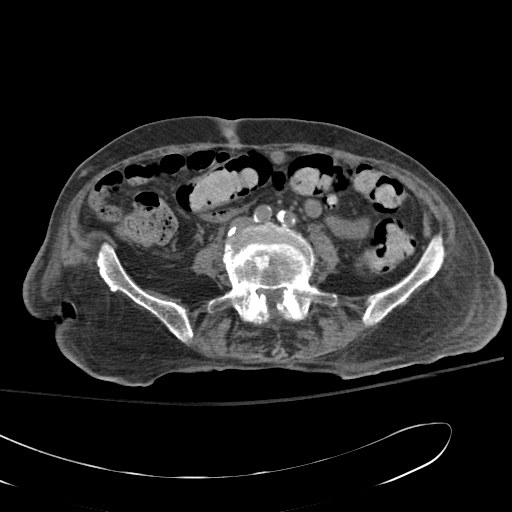
[im 87/168  soft-tissue]
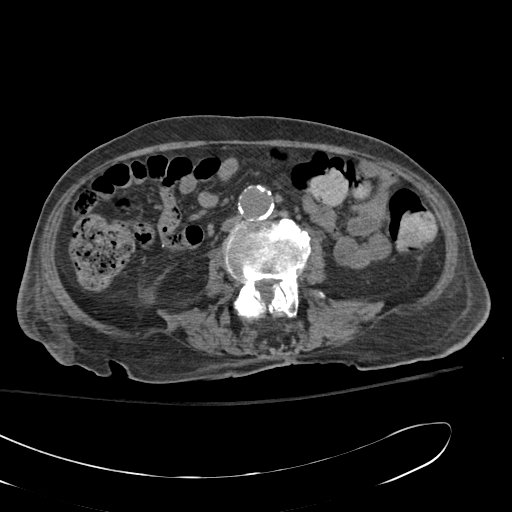
[im 94/168  soft-tissue]
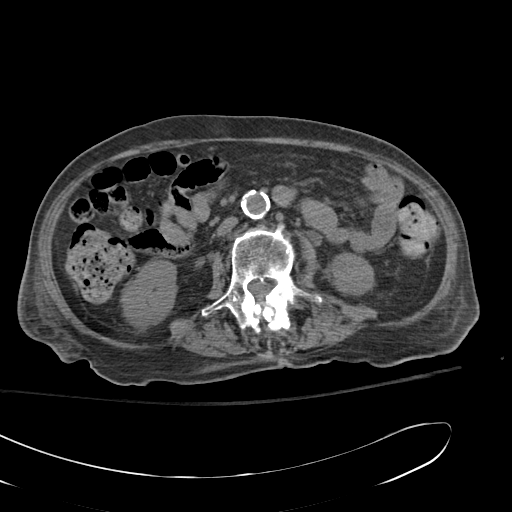
[im 107/168  soft-tissue]
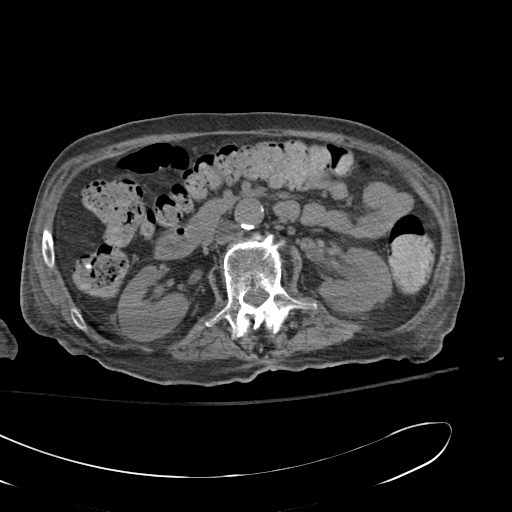
[im 107/168  bone]
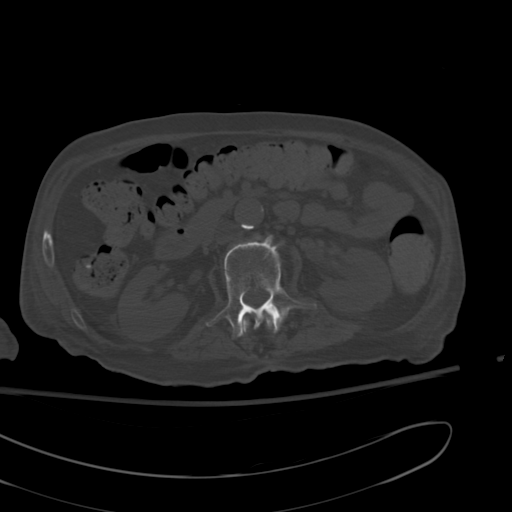
[im 121/168  soft-tissue]
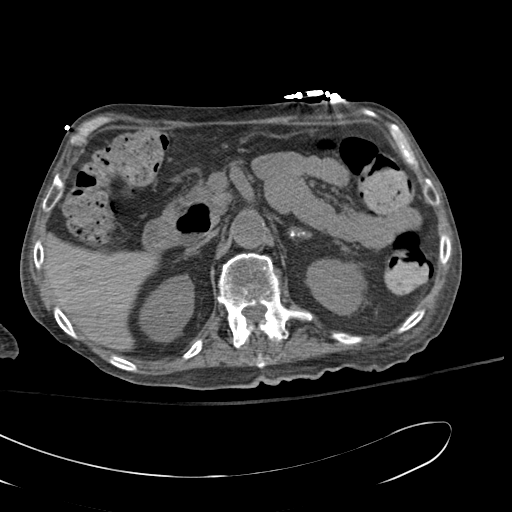
[im 134/168  soft-tissue]
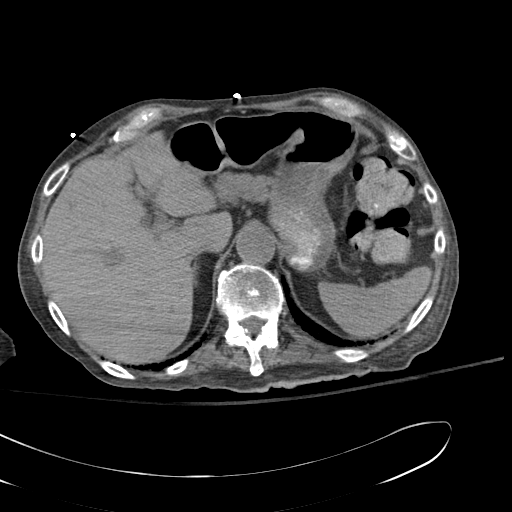
[im 141/168  lung]
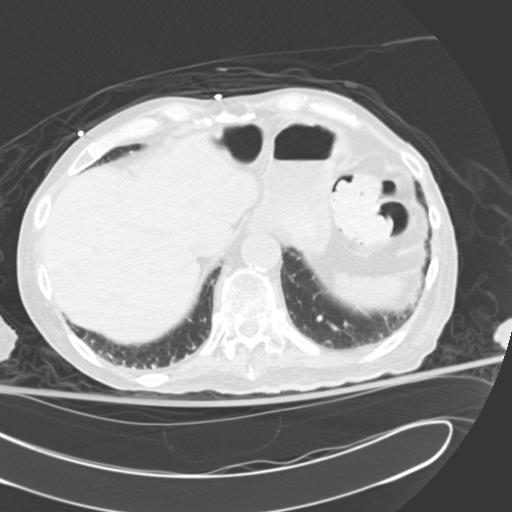
[im 147/168  soft-tissue]
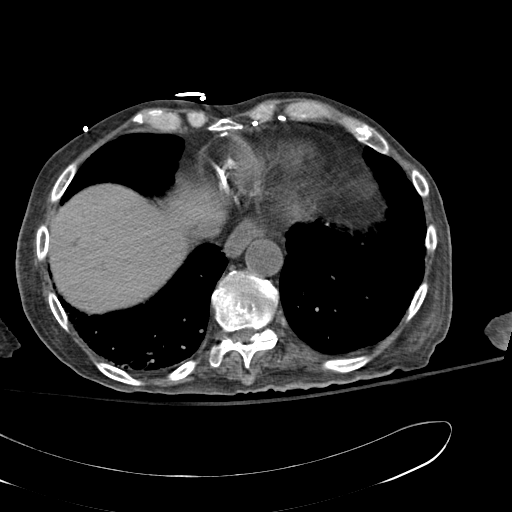
[im 147/168  lung]
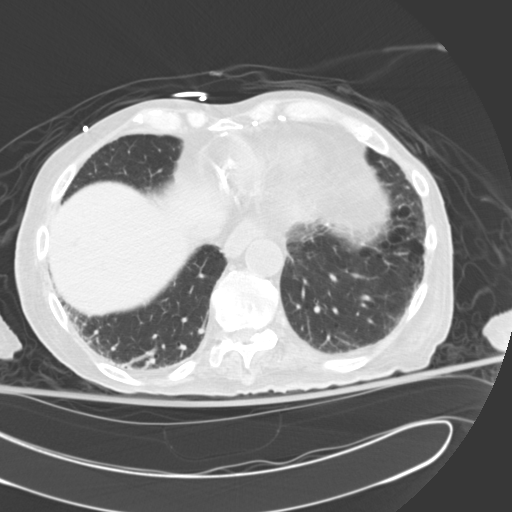
[im 154/168  lung]
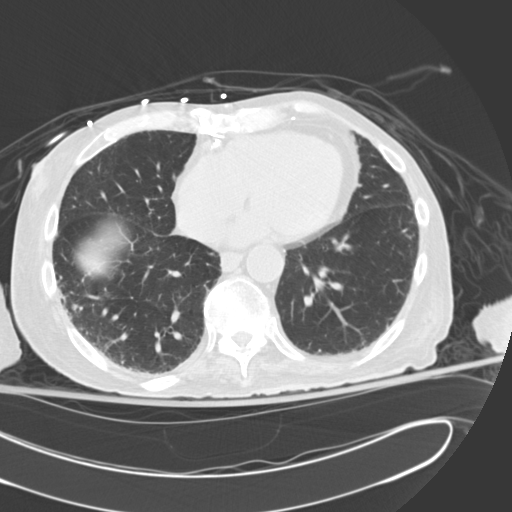
[im 161/168  soft-tissue]
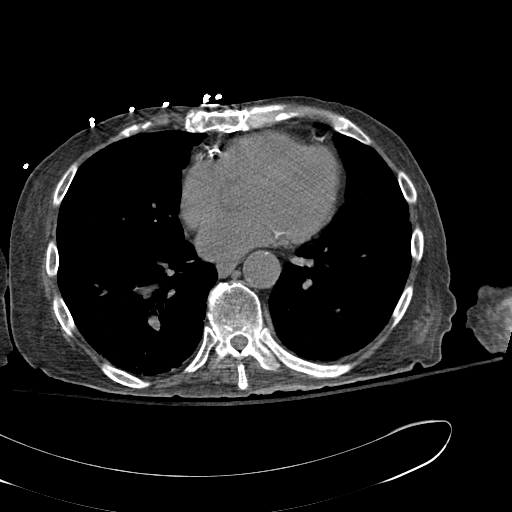
[im 161/168  lung]
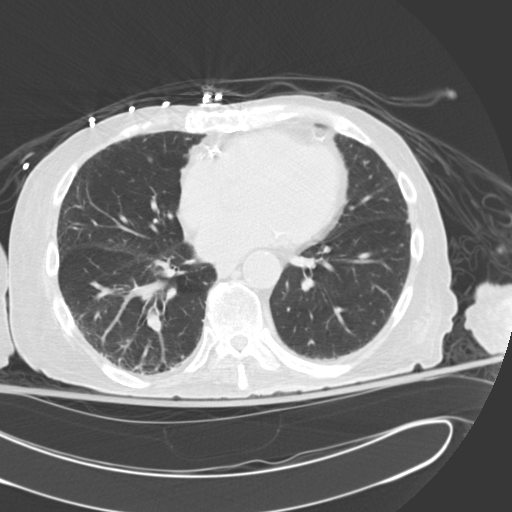

[15 of 32 positions shown; findings below may reference images not displayed]

FINDINGS: Standard nonenhanced CT obtained. Atelectasis and bronchiectasis
lung bases. No free air. Liver and gallbladder are unremarkable. Spleen
unremarkable. Pancreas unremarkable. Calcification in the left adrenal
consistent old insult. Bilateral nephrolithiasis noted. Large amount of
stone debris noted throughout the bladder particularly about the right
posterior lateral bladder at the level ureterovesical junction. Mild
ureteral dilatation. These finds are consistent with recently passed stones.
Bladder is nondistended. Foley catheter is present. Severe fecal impaction
is noted with stool noted throughout the colon particular rectosigmoid.
Abdominal aortic ectasia noted. Diffuse anasarca noted.
IMPRESSION: Severe fecal impaction with extremely large amount of stool
throughout the colon particular the rectosigmoid. Rectosigmoid is very
distended with stool. Disimpaction is suggested. Diffuse anasarca.
Nephrolithiasis. Stones noted in bladder.

## 2014-08-26 NOTE — Discharge Summary (Signed)
PATIENT NAME:  Leonard [name], Leonard [name] MR#:  [id number] DATE OF BIRTH:  03/29/1930  DATE OF ADMISSION:  02/11/2012 DATE OF DISCHARGE:  02/13/2012  PRIMARY CARE PHYSICIAN: [facility]  DISCHARGE DIAGNOSES:   1. Abdominal pain with fecal impaction.  2. Leukocytosis and possible urinary tract infection versus colonization.  3. Elevated troponin.  4. History of cerebrovascular accident.  5. Decubitus ulcers.  6. Adrenal insufficiency.  7. Possible seizure.  8. Hypokalemia.  9. Hypomagnesemia.   MEDICATIONS ON DISCHARGE:  1. Aspirin 81 mg p.o. daily.  2. Hydrocortisone 10 mg, 5 tablets t.i.d.  3. Fludrocortisone 0.1 mg, 3 tablets daily.  4. Percocet 5/325, 1 tablet every 6 hours as needed for pain.  5. Plavix 75 mg daily.  6. Pseudoephedrine 120 mg extended-release every 12 hours.  7. ProAmatine 10 mg t.i.d.  8. Lactulose 10 grams/15 mL, 30 mL b.i.d.  9. MiraLax 17 grams daily as needed for constipation.  10. Senna 1 tablet b.i.d. as needed for constipation.  11. Potassium chloride 20 mEq daily.  12. Magnesium oxide 400 mg b.i.d.   13. Zyvox 600 mg every 12 hours for 5 more days then stop.   TREATMENTS: Foley to leg bag. Change Foley every 3 weeks. Telfa pad covered with Kerlix wraps on bilateral lower extremities. Heel protectors while in bed.   DIET: Regular diet, regular consistency.  ACTIVITY: Activity as tolerated.   REFERRAL:  [facility]. [facility].   2. [facility].   FOLLOW UP:  Follow up in 1 to 2 days with doctor [facility].   CODE STATUS: The patient is a DO NOT RESUSCITATE.   HOSPITAL LABORATORY, DIAGNOSTIC AND RADIOLOGICAL DATA: A urine culture grew out 100,000 unidentified organisms. Urinalysis: 3+ leukocyte esterase, 3+ bacteria. Blood cultures: No growth. INR 1.0. Magnesium 1.7. BNP 5112, troponin 0.18, lipase 69, glucose 138, BUN 22, creatinine 0.7, sodium 144, potassium 3.4, chloride 103, CO2 29, calcium  8.0. Liver function tests: Albumin low at 2.1, total protein low at 5.9. White blood cell count 13.4, hemoglobin and hematocrit 12.4 and 37.6, platelet count of 233. EKG normal sinus rhythm, flattening of T waves laterally. CT scan of the head showed no acute intracranial abnormality. Chest x-ray showed no acute abnormality. CT scan of the abdomen and pelvis without contrast showed severe fecal impaction, an extremely large amount of stool throughout the colon and rectosigmoid, anasarca, nephrolithiasis, stones in the bladder. Troponin trended down to 0.12, magnesium upon discharge 2.0, potassium 3.5. White blood cell count 8.9, hemoglobin and hematocrit 10.4 and 31.4, platelet count of 192   HOSPITAL COURSE: The patient was admitted 02/11/2012, discharged on 02/13/2012. He came in with abdominal pain, possible seizure, and was confused. No further seizures in the ER or over the hospital course. He was admitted with abdominal pain and fecal impaction, started on lactulose. He was given Zyvox for possibility of urinary tract infection. He had elevated troponin, and he was already on aspirin and Plavix. Beta blocker was contraindicated secondary to adrenal insufficiency and hypotension. His potassium and magnesium were replaced.   PROBLEM LIST:  1. Abdominal pain and fecal impaction: The patient has been having bowel movements with the lactulose standing dose. This has been a chronic issue for him. He needs chronic bowel regimen. I will continue lactulose twice a day.  2. Leukocytosis, possible urinary tract infection versus colonization: I changed the Foley catheter. Usually I do not want to treat urinary tract infections  with chronic catheters unless there are signs of sepsis.  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 Need to follow up final urine culture results at the facility. The Foley catheter was changed on this hospital stay and needs to be changed every three weeks.   3. Elevated troponin with no signs of myocardial infarction: No complaints of chest pain. The patient is already on aspirin and Plavix. Unable to give beta blocker secondary to chronic hypotension. Overall prognosis is poor. Recommend conservative management.  4. History of cerebrovascular accident: On aspirin and Plavix.  5. History of decubitus ulcers: Follows up with Wound Care at [facility].  6. Adrenal insufficiency: On numerous medications, hydrocortisone, fludrocortisone pseudoephedrine, and ProAmatine. Blood pressure upon discharge was 128/98.  7. Possible seizure: The patient would not hold still for EEG and that was canceled. No further seizures here. We will hold off on any antiseizure medication.  8. Hypokalemia: This has been replaced orally.  9. Hypomagnesemia: This has been replaced orally.   The case was discussed with [facility] [place]. He is unsafe to go home, was made a DO NOT RESUSCITATE.  I would recommend Hosp[facility] followup at [facility] to avoid hospital readmissions.    TIME SPENT ON DISCHARGE: 40 minutes.  ____________________________ Herschell Dimes. Renae Gloss, MD rjw:cbb D: 02/13/2012 09:35:31 ET T: 02/13/2012 10:03:38 ET JOB#: [id number]  cc: Herschell Dimes. Renae Gloss, MD, <Dictator> [facility] Salley Scarlet MD ELECTRONICALLY SIGNED 02/15/2012 16:58

## 2014-08-26 NOTE — H&P (Signed)
PATIENT NAME:  Leonard [name], FRISCH MR#:  [id number] DATE OF BIRTH:  [birth year]  DATE OF ADMISSION:  01/24/2012  PRIMARY CARE PHYSICIAN:  [facility], [place], [place].   CHIEF COMPLAINT: Groin pain.   HISTORY OF PRESENT ILLNESS:   The patient appears to be a poor historian. No family member present in the Emergency Room. Mr. Leonard [name] is an 81 year old Caucasian gentleman who was most recently admitted 01/11/2012, discharged 01/16/2012. He was admitted at that time with weakness, possible urinary tract infection, discharged home after the patient refused to go to rehab, comes in today after he started having some groin pain. The patient has caregivers during the daytime and in the evening hours, asked them to call the EMS who brought him here. The patient has a chronic Foley catheter secondary to benign prostatic hypertrophy. His catheter was changed during the last admission. His urine has been persistently suggestive of urinary tract infection. The patient currently is afebrile. White count is stable. Internal medicine was consulted for admission and further evaluation. Next, in the ER the patient is hemodynamically stable. Denies any groin pain during my evaluation. He tells me, "now it is gone". The patient also reports he is going to go to "go to the restroom on Thursday". No family member is present in the Emergency Room.   PAST MEDICAL HISTORY:    1. Chronic hypotension with autonomic dysfunction, on Midodrine and Florinef.  2. Benign prostatic opportunity with chronic Foley.  3. Coronary artery disease.  4. Sacral decubitus chronic.  5. Severe malnutrition and failure to thrive.  6. The patient is bedbound and has heel protectors. He does not walk. The patient has caregivers at home. He had refused rehab during his last admission 01/11/2012.  7. Recurrent urinary tract infection.  8. History of neuropathy.  9. Anxiety.  10. Cerebrovascular accident with left-sided hemiparesis. The  patient is bedbound.  11. History of bradycardia.   MEDICATIONS: (Through the discharge summary of 01/16/2012) 1. Diazepam 5 mg 3 times a day.  2. Gabapentin 300 mg b.i.d.  3. Ambien 10 mg at bedtime. 4. Hydrocortisone 10 mg 5 tablets t.i.d.  5. Fludrocortisone 0.1 mg 3 tablets daily.  6. Plavix 75 mg daily.  7. Aspirin 81 mg daily. 8. Pseudoephedrine 120 milligrams every 12 hours.  9. Lactulose 30 mL p.r.n. for constipation.  10. ProAmatine 10 mg 3 times a day.  11. DuoDERM to buttocks.   DIET: Regular.   The patient also was set up with home hospice.   ALLERGIES: Penicillin and sulfa.   SOCIAL HISTORY: The patient comes from home. He lives alone. He has caregivers at home.   PAST SURGICAL HISTORY:  1. Cervical spine surgery.  2. Back surgery.  3. Bypass surgery.   FAMILY HISTORY: From old records, mother had osteoarthritis. Father had Alzheimer's disease.   REVIEW OF SYSTEMS: Limited. CONSTITUTIONAL: No fever. Positive for fatigue and weakness. EYES: No blurred or double vision. ENT: No tinnitus or, ear pain. RESPIRATORY: No cough, wheeze, or hemoptysis. CARDIOVASCULAR: No chest pain, orthopnea or edema. GASTROINTESTINAL: No nausea, vomiting, abdominal pain or constipation. GU: No dysuria or hematuria. ENDOCRINE: No polyuria or nocturia. HEMATOLOGY: No anemia or easy bruising. SKIN: Positive for sacral decubitus ulcers which is chronic. MUSCULOSKELETAL: Positive for chronic leg pain and back pain. NEUROLOGIC: History of cerebrovascular accident in the past with chronic left-sided hemiparesis. PSYCH: No anxiety or depression. All other systems reviewed and negative.   PHYSICAL EXAMINATION:  GENERAL: The patient is awake,  alert, oriented x2. There is mild dementia.   VITAL SIGNS: Afebrile, pulse 64, blood pressure 100/54, saturations are 97% on room air.   HEENT: Atraumatic, normocephalic. Pupils are equal, round, and reactive to light and accommodation. Extraocular movements  intact. Oral mucosa is dry.   NECK: Supple. No JVD. No carotid bruit.   LUNGS: Clear to auscultation bilaterally. No rales, rhonchi, respiratory distress, or labored breathing.   CARDIOVASCULAR: Both the heart sounds are normal. Rate, rhythm is regular. PMI not lateralized. Chest nontender.   EXTREMITIES: Pitting edema 1+ in both lower extremities. The patient has heel protectors. There is a sacral decubitus. Unable to test secondary to patient positioning in the bed in the ER.   SKIN: Warm and dry. The patient has several tattoos over the skin.   NEUROLOGIC: The patient has generalized weakness. His left-sided weakness is more than right. Grossly appears to be chronic neurological changes.   LABORATORY, RADIOLOGICAL AND DIAGNOSTIC DATA: Urinalysis positive for WBC 3,488 per high powered field, 2+ leukocyte esterase, turbid. Basic metabolic panel within normal limits. CBC: white count 9.7, hemoglobin and hematocrit is 8.7 and 26.1, platelet count 249.   ASSESSMENT AND PLAN: 81 year old Mr. Leonard [name] with multiple medical problems who comes in with:  1. Weakness, presents with generalized weakness and failure to thrive. The patient was recently admitted from 09/04 to 09/09 with weakness and similar symptoms, had constipation at that time. The patient was recommended to go to rehab, however, was adamant and refused it. The patient now is agreeable to go. He states he is going to rest home on Thursday. We will need to get in contact with the patient's son regarding the skilled facility where he is supposed to be going.  2. 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 His urine appears contaminated. No signs of sepsis at this time. The patient has had similar presentation in the past. His white count is normal. He is afebrile, not tachycardia. We will not treat this. His Foley was changed during last admission.  3. Chronic hypotension due to autonomic dysfunction and renal insufficiency. Continue midodrine,  Florinef, hydrocortisone and pseudoephedrine.  4. Anemia of chronic disease. Hemoglobin is stable at 8.6.  5. Benign prostatic hypertrophy. The patient's Flomax was stopped during last admission. The patient has chronic Foley.  6. History of coronary artery disease. Continue aspirin and Plavix.  7. Sacral decubitus which is chronic. Apply DuoDERM.   8. Severe malnutrition. Albumin of 1.6 with failure to thrive.  9. We will get care management's assistance with placement. No family in the ER. The patient is a FULL CODE. We will need to address CODE STATUS with the patient's son who is HPOA.    TIME SPENT: 50 minutes.   ____________________________ Wylie HailSona A. Allena KatzPatel, MD sap:ap D: 01/24/2012 01:52:34 ET T: 01/24/2012 08:27:12 ET JOB#: [id number]  cc: Lachrista Heslin A. Allena KatzPatel, MD, <Dictator> Jodell Ciproennis E. Chrismon, PA Deundre Thong A Shelisha Gautier MD ELECTRONICALLY SIGNED 01/26/2012 13:24

## 2014-08-26 NOTE — H&P (Signed)
PATIENT NAME:  Leonard [name], Leonard [name] MR#:  [id number] DATE OF BIRTH:  [birth year]  DATE OF ADMISSION:  01/11/2012  PRIMARY CARE PHYSICIAN: Dr. Dossie Arbour  ER PHYSICIAN: Dr. Cyril Loosen  CHIEF COMPLAINT: Constipation.   HISTORY OF PRESENT ILLNESS: Patient is an 81 year old male with history of chronic hypertension because of right renal insufficiency, history of recent Klebsiella urinary tract infection in March, chronic urinary tract infections and chronic Foley, coronary artery disease, history of cerebrovascular accident with left side hemiparesis brought from home. Patient called the ambulance because he was constipated. Last bowel movement was two weeks ago and he came in here for constipation. He says mild abdominal pain but main issue is constipation. Evaluated in the ER, found to have urinary tract infection. I was asked to admit because he had history of Klebsiella and for possibility of multidrug resistant urinary tract infection so we are going to admit him until the sensitivities are back. Patient complains of mild cough and phlegm, but no trouble breathing, no fever, no nausea, no vomiting. Appetite is good. Constipation for last two weeks and not able to have a full bowel movement and mild abdominal pain mainly in the epigastric area. Denies any dysuria, but recently Foley was changed two or three days ago by home health people.   PAST MEDICAL HISTORY:  1. History of persistent hypertension. He was evaluated in March by Dr. Mariah Milling. Had an echo done which showed ejection fraction within normal limits and also started fluids at that time. Patient finally started on midodrine and also Florinef and desmopressin and hydrocortisone.  2. Klebsiella urinary tract infection, which was treated with Keflex. 3. History of benign prostatic hypertrophy. 4. Constipation. 5. History of neuropathy. 6. Anxiety. 7. History of cerebrovascular accident with left-sided hemiparesis. 8. History of sinus bradycardia.    MEDICATIONS: Supposed to be on:  1. Midodrine 10 mg t.i.d.  2. Desmopressin 0.2 mg b.i.d.  3. Pseudoephedrine 120 mg b.i.d.  4. Florinef 0.3 mg daily.  5. Hydrocodone 50 mg t.i.d.  6. Aspirin 81 mg daily.  7. Plavix 75 mg p.o. daily.  8. Neurontin 300 mg p.o. b.i.d.  9. Flomax 0.4 mg daily.  10. MiraLax 17 grams for constipation. 11. Diazepam 3 times daily 2 mg for insomnia and muscle spasms.   ALLERGIES: Penicillin and sulfa.   SOCIAL HISTORY: Patient was at [facility] before but he told me today he is followed by the home health people and he called the ambulance by himself. No smoking. No drinking. Lives alone.   PAST SURGICAL HISTORY:  1. Cervical spine surgery. 2. Back surgery. 3. Bypass surgery.   FAMILY HISTORY: Mother had osteoarthritis. Father Alzheimer's disease.   REVIEW OF SYSTEMS: Patient has dementia and review of systems is limited. CONSTITUTIONAL: Feels like constipated. No weight gain. No fever. No fatigue. EYES: No blurred vision. ENT: No tinnitus. No epistaxis. No difficulty swallowing. RESPIRATORY: Has cough and phlegm. CARDIOVASCULAR: No chest pain. No orthopnea. No palpitations. GASTROINTESTINAL: No nausea. No vomiting. Mild abdominal pain and constipation for last two weeks. GENITOURINARY: No dysuria. Has chronic Foley changed two days ago. ENDOCRINE: No polyuria. No nocturia. HEMATOLOGIC: No anemia. INTEGUMENTARY: No skin rash. MUSCULOSKELETAL: No joint pain. NEUROLOGIC: No numbness. Has history of dementia and history of previous stroke with left-sided weakness. PSYCH: No anxiety or insomnia. Has dementia.   PHYSICAL EXAMINATION: GENERAL: Patient is an 81 year old elderly male appearing in no distress. Patient is alert and oriented.   VITAL SIGNS: Temperature 97.6, pulse 70, respirations 16,  blood pressure 110/66, sats 100% on room air.   HEENT: Head atraumatic, normocephalic. Pupils are equally reacting to light. Extraocular movements are intact.    ENT: No tympanic membrane congestion. No turbinate hypertrophy. No oropharyngeal erythema.   NECK: Normal range of motion. No jugular venous distention. No carotid bruits.  CARDIOVASCULAR: S1, S2 regular. No murmurs. No gallops. PMI not displaced. .    LUNGS: Patient has no rales. Clear to auscultation. No wheeze. No rales.   ABDOMEN: Soft, mild epigastric tenderness present. Bowel sounds present.   GENITOURINARY: Foley is present, very cloudy urine is observed.   EXTREMITIES: Patient has no extremity edema.   NEURO: Has left-sided paralysis.   PSYCH: Oriented. Has baseline dementia.   LABORATORY, DIAGNOSTIC AND RADIOLOGICAL DATA: Chest x-ray shows increased interstitial markings in both lungs especially inferiorly on the left and diffusely on the right. Patient's findings are not entirely new. Cannot exclude superimposed atelectasis versus interstitial edema and also chronic pulmonary changes. Hemoglobin 8.9, hematocrit 26.5, platelets 328. Electrolytes: Sodium 137, potassium 3.4, chloride 102, bicarbonate 28, BUN 22, creatinine 0.65, glucose 102.   Urinalysis: Leukocyte esterase 2+, WBCs 11,343 and bacteria 3+. Troponin less than 0.02. Patient also has budding yeast and calcium oxylate crystals in the urine. EKG normal sinus rhythm with no ST-T changes.     ASSESSMENT AND PLAN:  381. 81 year old male with constipation. Will start the patient on lactulose along with Colace and senna and will use  enemas tomorrow if no help. Will also get baseline x-ray of the abdomen to evaluate for bowels.  2. Urosepsis. Patient has a urinary tract infection with yeast as well. Follow the cultures. He was treated for Klebsiella urinary tract infection in March with Keflex. At this time will continue vancomycin and Zosyn. Follow the urine cultures.  3. Patient had hypoxia. Sats were around 91% to 92% during my visit. Chest x-ray showing acute on chronic fibrotic changes along with some interstitial  infiltrates versus edema. Patient will be starting antibiotics and repeat chest x-ray tomorrow. Patient's blood pressure also is around 90/50 during my visit although documented blood pressure initially was normal. He has history of chronic hypertension and also on midodrine and Florinef so we will keep him on telemetry and watch patient's blood pressure. We cannot give any Lasix at this time as he doesn't  look like volume overloaded. Patient can be continued on Florinef along with midodrine and also hydrocortisone.  4. History of deconditioning and also history of benign prostatic hypertrophy. Get a physical therapy consult and continue Flomax for benign prostatic hypertrophy.  5. Chronic Foley and may be catheter-related colonization of the urine. At this time follow the urine cultures.  6. History of coronary artery disease. Continue aspirin and Plavix.   TIME SPENT ON HISTORY AND PHYSICAL: About 60 minutes.    ____________________________ Katha HammingSnehalatha Dolly Harbach, MD sk:cms D: 01/11/2012 20:47:15 ET T: 01/12/2012 06:39:51 ET JOB#: [id number]  cc: Katha HammingSnehalatha Hinda Lindor, MD, <Dictator> Steele SizerMark A. Crissman, MD Katha HammingSNEHALATHA Chae Shuster MD ELECTRONICALLY SIGNED 01/30/2012 14:04

## 2014-08-26 NOTE — Discharge Summary (Signed)
PATIENT NAME:  Leonard [name], Leonard [name] MR#:  [id number] DATE OF BIRTH:  06-24-1929  DATE OF ADMISSION:  01/11/2012 DATE OF DISCHARGE:  01/16/2012  PRIMARY CARE PHYSICIAN:  [facility],  [place], [place]    FINAL DIAGNOSES:  1. Weakness.  2. Constipation.  3. Chronic hypotension with autonomic dysfunction.  4. Benign prostatic hypertrophy with chronic Foley.  5. Coronary artery disease.  6. Decubitus.  7. Severe malnutrition.   MEDICATIONS ON DISCHARGE:  1. Diazepam 5 mg 3 times a day.  2. Gabapentin 300 mg twice a day.  3. Ambien 10 mg at bedtime.  4. Hydrocortisone 10 mg, 5 tablets 3 times a day.  5. Fludrocortisone 0.1 mg 3 tablets daily.  6. Plavix 75 mg daily.  7. Aspirin 81 mg daily.  8. Pseudoephedrine 120 mg every 12 hours. 9. Lactulose 10 grams per 15 mL, 30 mL once a day as needed for constipation.  10. ProAmatine 10 mg 3 times a day.  11. Stop Coreg. 12. DuoDERM to buttock.   DIET: Regular diet, regular consistency.   ACTIVITY: Activity as tolerated.   REFERRALS: To home Hospice.  FOLLOWUP: Follow up in one week with Dortha Kern, PA.   REASON FOR ADMISSION: The patient was admitted 01/11/2012 and discharged 01/16/2012. He came in with constipation. The admitting physician was asked to admit with history of multidrug resistant urinary tract infection. The patient did not have a fever, was not tachycardic, and white count was normal. The patient initially was started on Zosyn.   LABORATORY AND RADIOLOGICAL DATA: Urine culture grew out mixed bacterial organisms suggestive of contamination. Troponin negative. Urinalysis: 2+ leukocyte esterase, 3+ bacteria. Glucose 102, BUN 22, creatinine 0.65, sodium 137, potassium 3.4, chloride 102, CO2 28, calcium 8.6, albumin poor at 1.6. White blood cell count 8.6, hemoglobin and hematocrit 8.9 and 26.5, platelet count 328. Chest x-ray showed increased interstitial markings both lungs, especially inferiorly, not entirely  new, atelectasis, interstitial edema, chronic pulmonary fibrotic change all in the differential. EKG normal sinus rhythm, nonspecific ST-T wave changes. Blood cultures negative. Abdominal AP x-ray showed bowel gas pattern, could reflect mild constipation. Ferritin 819, iron saturation 39, iron serum 47, iron binding capacity 120. Hemoglobin upon discharge 8.5. White blood cell count upon discharge 7.1. Creatinine upon discharge 0.73. Last chest x-ray showed slight improvement of the appearance of the pulmonary interstitium, mildly increased markings.   HOSPITAL COURSE PER PROBLEM LIST:  1. For the patient's weakness, the patient does have a history of cerebrovascular accident and does not walk very well. I did offer rehab. The patient absolutely refuses and wants to go home. I spoke with the power of attorney. The patient only has five hours of home care. The patient absolutely refused rehabilitation. I have no other choice but to send the patient home. We did set up Hospice care at home to try to keep the patient out of the hospital.  2. Constipation. This was treated with lactulose during the hospital stay.  3. Chronic hypotension. The patient did actually require stress dose steroids while the patient was here, and back on his usual medications of hydrocortisone, fludrocortisone, midodrine, and pseudoephedrine. Blood pressure is stable upon discharge at 134/68.  4. Benign prostatic hypertrophy with chronic Foley. I stopped the Flomax since he does have a chronic Foley. The Foley was changed here during this hospitalization. I do recommend changing the Foley catheter on a monthly basis. I did not treat chronic colonization in his chronic Foley catheter; I just  changed his Foley catheter. No indication for treatment for urinary tract infection in this patient unless symptoms of sepsis are there.  There were no signs of sepsis. Normal white count. The patient was not tachycardic and there was no fever.  Antibiotics were stopped.  5. Coronary artery disease. The patient is on aspirin and Plavix. Coreg was stopped secondary to hypotension.  6. Decubitus. Since the patient is not walking around and poor nutrition, DuoDERM applied.  7. For the patient's severe malnutrition, the patient only has five hours' worth of aides at home. Unsure how much he is actually eating. Overall prognosis is poor with an albumin of 1.6. The patient wanted to go home instead of rehab. The patient will follow up with Hospice care. Overall prognosis is poor.   CODE STATUS: The patient is a FULL CODE.   TIME SPENT ON DISCHARGE: 40 minutes.    ____________________________ Herschell Dimesichard J. Renae GlossWieting, MD rjw:bjt D: 01/16/2012 16:07:13 ET T: 01/17/2012 13:46:55 ET JOB#: [id number]  cc: Herschell Dimesichard J. Renae GlossWieting, MD, <Dictator> Jodell Ciproennis E. Chrismon, PA Salley ScarletICHARD J Frances Ambrosino MD ELECTRONICALLY SIGNED 01/17/2012 16:28

## 2014-08-26 NOTE — Discharge Summary (Signed)
PATIENT NAME:  Leonard ForesterCOOK, Jaxton G MR#:  [id number] DATE OF BIRTH:  Jun 25, 1929  DATE OF ADMISSION:  01/24/2012 DATE OF DISCHARGE:  01/25/2012  PRIMARY CARE PHYSICIAN: Dortha Kernennis Chrismon, PA, [facility]   DISCHARGE DIAGNOSES:  1. Urinary tract infection.  2. Multiple decubitus ulcers. 3. Severe malnutrition, failure to thrive.  4. Adrenal  insufficiency.  5. Anemia of chronic disease.   CONSULTS: Wound consult.  IMAGING STUDIES: None.   ADMITTING HISTORY AND PHYSICAL: Please see detailed History and Physical dictated on 01/24/2012. In brief, 81 year old Caucasian patient presented to the Emergency Room complaining of some groin pain and weakness. The patient was found to have urinary tract infection and was admitted to the hospitalist service.    HOSPITAL COURSE:  1. Urinary tract infection. The patient was started on ciprofloxacin, had gram-negative rods growing in the urine but responding well to ciprofloxacin, which will be continued for five more days. This could also be colonization but needs to be treated in the setting of weakness. This ID and sensitivity needs to be followed up at the skilled nursing facility when available.  2. Multiple decubitus ulcers. The patient was seen by wound consult who suggested regular dressings, which will be continued at the skilled nursing facility.  3. Malnutrition, failure to thrive.  The patient has had no care at home in spite of having Home Health. The patient needs a higher level of care and will be sent to a skilled nursing facility at the time of discharge for further management.   On the day of discharge, the patient is awake and alert, talking, conversational, cardiovascular examination is normal and he is being discharged to a skilled nursing facility in fair condition.    DISCHARGE MEDICATIONS:  1. Aspirin 81 mg oral once a day.  2. Hydrocortisone 15 mg oral 3 times a day.  3. Fludrocortisone 0.3 mg oral once a day.   4. Acetaminophen/ oxycodone 325/5 mg, 1 tablet orally every six hours as needed for pain.  5. Plavix 75 mg oral once a day.  6. Pseudoephedrine 120 mg oral twice a day.  7. ProAmatine 10 mg orally 3 times a day.  8. Lactulose 30 mL oral once a day as needed for constipation.  9. Ciprofloxacin 500 mg oral twice a day for five days.   DISCHARGE INSTRUCTIONS: Follow up with primary care physician in a week. Wound care as recommended by wound consult team. The patient will be on a regular diet with activity as tolerated with assistance.   TIME SPENT: Time spent today on this discharge dictation along with coordinating and counseling of the patient was 40 minutes.   ____________________________ Molinda BailiffSrikar R. Mukhtar Shams, MD srs:bjt D: 01/25/2012 11:50:11 ET T: 01/25/2012 12:20:11 ET JOB#: [id number]  cc: Wardell HeathSrikar R. Brenna Friesenhahn, MD, <Dictator> Jodell Ciproennis E. Chrismon, PA Orie FishermanSRIKAR R Chrishon Martino MD ELECTRONICALLY SIGNED 01/31/2012 12:14

## 2014-08-26 NOTE — H&P (Signed)
PATIENT NAME:  Leonard [name], Leonard [name] MR#:  [id number] DATE OF BIRTH:  [birth year]  DATE OF ADMISSION:  02/11/2012  PRIMARY CARE PHYSICIAN: Dr. Vonita MossMark Crissman  ER PHYSICIAN: Dr. Enedina FinnerGoli  CHIEF COMPLAINT: Abdominal pain.    HISTORY OF PRESENT ILLNESS: This is an 81 year old male patient sent from [facility] because of abdominal pain. When EMS arrived patient had been complaining of abdominal pain for an hour. In route to the hospital patient had a seizure according to EMS personnel. Patient also was confused and he was trying to bite the EMS according to documentation and Dr. Enedina FinnerGoli said he did not have any more seizures in the ER and his CT of the head is normal. Abdominal CAT scan showed fecal impaction. Patient is a very poor historian. History is obtained from the ER charts and also Dr. Enedina FinnerGoli. Patient was here in September from 17th to 18th of the last month. At that time he was admitted for possible urinary tract infection.   PAST MEDICAL HISTORY: 1. History of chronic hypotension with autonomic dysfunction on midodrine and Florinef.  2. History of benign prostatic hypertrophy with chronic Foley. 3. Coronary artery disease. 4. Sacral decubitus. 5. Severe malnutrition with failure to thrive. 6. History of bedbound status secondary to cerebrovascular accident and left-sided hemiparesis. Patient does have severe decubitus in the heels. 7. History of urinary tract infection. 8. Neuropathy.  9. Anxiety.  10. Patient was discharged to [facility] during the last discharge. Patient also was seen by wound care people last time and recommended wound care. Patient went to [facility] on the 18th and started to have abdominal pain with fecal impaction today.   ALLERGIES: Penicillin and sulfa.   PAST SURGICAL HISTORY:  1. Cervical spine surgery.  2. Back surgery.  3. Bypass surgery.   SOCIAL HISTORY: Patient is at [facility] before and patient right now is in [facility]. Has no smoking or  drinking history.   FAMILY HISTORY: Mother had osteoarthritis and father had Alzheimer's.   REVIEW OF SYSTEMS: Right now unable to obtain because of dementia. Patient says he doesn't know why he is here and denies any complaints.   PHYSICAL EXAMINATION:  VITAL SIGNS: Temperature 97.7, pulse 152 initially, repeat 166, blood pressure 134/87, repeat blood pressure 93/67.   GENERAL: Patient is awake but not oriented to time, place, person, not in obvious distress.   HEENT: Head atraumatic, normocephalic. Pupils are equally reacting to light.   ENT: No tympanic membrane congestion. No turbinate hypertrophy. No oropharynx erythema.    NECK: Normal range of motion. No JVD. No carotid bruits.  CARDIOVASCULAR: S1, S2 regular. No murmurs. PMI not displaced.   LUNGS: Clear to auscultation. No wheeze. No rales.   ABDOMEN: Soft. Patient has no tenderness. Bowel sounds present. No organomegaly. No hernias.   EXTREMITIES: No extremity edema. No cyanosis. Patient does have decubitus and he does have heal protectors bilaterally in both feet. Patient has chronic sacral ulcers.   SKIN: Patient has ulcers on both feet, metatarsals and also on the sacrum, stage II sacral ulcer.   NEUROLOGIC: He is awake but not oriented to time, place, person due to dementia. No obvious focal neurological deficit is observed.   LABORATORY, DIAGNOSTIC, AND RADIOLOGICAL DATA: CAT scan of the abdomen showed severe fecal impaction with extremely large amount of stool throughout the colon particularly in the rectosigmoid. Head CT showed no acute abnormality. Chest x-ray clear. WBC 13.4, hemoglobin 12.4, hematocrit 37.6, platelets 233. Electrolytes: Sodium 144, potassium  3.4, chloride 103, bicarbonate 29, BUN 22, creatinine 0.70, glucose 138. Troponin is slightly up at 9.18, lipase 69. EKG normal sinus rhythm with no ST-T changes, 88 beats per minute.   ASSESSMENT AND PLAN:  1. Patient is an 81 year old male patient who has  dementia brought in from [facility] with abdominal pain, has severe fecal impaction. Patient denies abdominal pain when I went to talk to him. Start the patient on lactulose, MiraLax and stool regimen with Senna. Abdomen is soft and has no tenderness so continue that. If it does not help will start disimpaction tomorrow. Patient has slight leukocytosis, probably secondary to fecal impaction also with constipation. Check the CBC in the morning. 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 He also had Candida in the urine that was resistant to vancomycin and Levaquin and linezolid sensitive and we can start the patient on linezolid and see.  2. Possible seizure according to EMS but none in the ER. Patient is not postictal so will get EEG. CT head is unremarkable. Not sure if he really had a seizure or not but get the EEG and use Ativan as needed.  3. Hypokalemia. Replace the potassium.  4. Slightly elevated troponins with no chest pain, no EKG changes. Cycle his troponins and put him on telemetry. He is already on aspirin and Plavix so continue them and unable to give beta blocker because of chronic hypotension with autonomic neuropathy. Patient denies any chest pains so will use sublingual nitro only if he gets chest pain. Cycle the troponins. Patient does have several decubitus in the heels and sacral area. Continue the wound care as per protocol. Patient will be admitted to telemetry.   TIME SPENT: About 60 minutes.   ____________________________ Katha Hamming, MD sk:cms D: 02/11/2012 20:22:13 ET T: 02/12/2012 08:54:07 ET JOB#: [id number] cc: Katha Hamming, MD, <Dictator> Steele Sizer, MD Katha Hamming MD ELECTRONICALLY SIGNED 02/29/2012 13:52

## 2014-08-31 NOTE — Consult Note (Signed)
Impression: 81yo WM w/ h/o left sided paralysis admitted after falling who has bilateral LE pressure ulcers and possible Klebsiella UTI.  There does not appear to be any evidence for infection in the wounds nor any surrounding cellulitis.  He has no fever but his WBC is up a bit.. The positive wound culture could represent organisms present on the skin.  His urine does have some WBC and he is growing Klebsiella. Will d/c antibiotics.Clindamycin will not cover the GNR in the urine.  Will change him to doxycycline. Which will have activity against both the urine Klebsiella and the organisms found in the wound, although I am not sure that any treatment is necessary for the wounds. Keep wounds dry.  Agree with wound center dressing recommendations. 4) Will sign off.  Please call with questions.    Electronic Signatures: Dimitrius Steedman, Rosalyn GessMichael E (MD) (Signed on 15-Mar-13 13:33)  Authored   Last Updated: 15-Mar-13 13:43 by Jalie Eiland, Rosalyn GessMichael E (MD)

## 2014-08-31 NOTE — Consult Note (Signed)
Brief Consult Note: Diagnosis: hypotension of unclear etiology, bradycardia.   Patient was seen by consultant.   Consult note dictated.   Orders entered.   Discussed with Attending MD.   Comments: Agree with 2 D echo.  No obvious cause of hypotension. If EF is low, will consider Digoxin.  Will add Fludrocortison as a trial and consider Midodrin later.  Although a pacemaker might be helpful, I don't recommend this at this time due to high risk of infection.  Electronic Signatures: Lorine BearsArida, Muhammad (MD)  (Signed 04-Apr-13 09:35)  Authored: Brief Consult Note   Last Updated: 04-Apr-13 09:35 by Lorine BearsArida, Muhammad (MD)

## 2014-08-31 NOTE — Consult Note (Signed)
PATIENT NAME:  [name], [name] MR#:  [id number] DATE OF BIRTH:  03/Leonard Forester26/1931  DATE OF CONSULTATION:  07/22/2011  REFERRING PHYSICIAN:  Dr. Sherryll BurgerShah  CONSULTING PHYSICIAN:  Rosalyn GessMichael E. Emmit Oriley, MD  REASON FOR CONSULTATION: Possible cellulitis.   HISTORY OF PRESENT ILLNESS: The patient is a 81 year old white man with a history of left-sided paralysis and dementia who was admitted on 07/19/2011 after having a fall. Apparently he had fallen from his bed. He is generally bedbound. He did not have loss of consciousness per the history of present illness. He was planning on coming to be seen for  possible cellulitis. However, on admission the admission History and Physical indicates no lower extremity erythema. He has had no fever since being in the hospital and his admission white count was normal. He states that he has had some ulcers on the foot that have been long-standing. They are mainly on the posterior aspect of his foot. He has some pain in his foot, right greater than left. He has had some swelling in the past but he states that the swelling is better since he has been in the hospital. He is fairly confused and was not a very good historian. Most of the history was obtained from the chart.   ALLERGIES: Penicillin and sulfa drugs.   PAST MEDICAL HISTORY:  1. Dementia.  2. Left-sided paralysis with global weakness.  3. Degenerative spinal disease.  4. Congestive heart failure.  5. Hypertension.  6. Coronary artery disease status post coronary artery bypass graft.   SOCIAL HISTORY: The patient lives at home. He does not smoke. He does not drink. He has a [facility] nurse who provides assistance 3 times a week.   FAMILY HISTORY: Positive for osteoarthritis and Alzheimer's dementia.   REVIEW OF SYSTEMS:  GENERAL: The patient denies any fevers, chills, or sweats. RESPIRATORY: No cough, no shortness of breath. No sputum production. CARDIAC: No chest pains or palpitations. GI: No nausea, vomiting,  abdominal pain, or change in his bowels.  MUSCULOSKELETAL: He has had swelling in his lower extremities with some pain, right greater than left. SKIN: He has had ulcerations on the lower extremities posteriorly,  more on the right than the left. He states he had some redness of his right leg as well.   PHYSICAL EXAMINATION:  VITAL SIGNS: T-max of 100.7, T-current 98.3, pulse 68, blood pressure 92/50, 96% on room air.   GENERAL: Cachectic 81 year old white man in no acute distress.   HEENT: Normocephalic, atraumatic.   LUNGS: Clear to auscultation bilaterally. Good air movement. No focal consolidation.   HEART: Regular rate and rhythm without murmur, rub, or gallop.   ABDOMEN: Soft, nontender, and nondistended. No hepatosplenomegaly.   EXTREMITIES: No edema.   SKIN: He had ulcers with eschars present on the heels bilaterally. These were dry with no purulence. There is no surrounding erythema. There was no lymphangitic streaking appreciated.   NEUROLOGIC: The patient was awake and interactive, but appeared confused. He was moving his upper extremities, but not moving his lower extremities. His ankles were in extensor position, consistent with lack of use. He had loss of muscle tone as well.   PSYCHIATRIC: Mood and affect appeared pleasant.   LABORATORY, DIAGNOSTIC, AND RADIOLOGICAL DATA: BUN 12, creatinine 0.55. White count of 11.6, hemoglobin 10.9, platelet count 143, ANC 8.7. White count on admission was 7.1 with an ANC of 5.4. TSH was 0.287 with a FT4 of 1.01. Blood cultures from admission show no growth. A wound culture is  growing MRSA and Proteus. Urinalysis had negative nitrites, 3+ leukocyte esterase, 91 white cells, 15 red cells per high-powered field. Urine culture is growing Klebsiella. A CT scan of the cervical spine without contrast showed no fracture. There were postsurgical changes present and degenerative changes present. Right ankle x-ray showed no bony abnormalities. Lower  extremity ultrasound showed no deep vein thrombosis. CT scan of the head without contrast showed no acute intracranial process.   IMPRESSION: This is a 81 year old white man with a history of left-sided paralysis admitted after falling who has bilateral lower extremity pressure ulcers, and possible Klebsiella urinary tract infection.   RECOMMENDATIONS:  There does not appear to be any evidence for infection of the wounds nor any surrounding cellulitis. He has no fever but his white count is up a bit.  The positive wound culture could represent organisms present on the skin.  His urine does have some white blood cells and he is growing Klebsiella. Clindamycin will not cover the gram-negative in the urine. We will change him to doxycycline, which will have activity against both the urine Klebsiella and the organisms found in the wound, although I am not sure that any treatment is necessary for the wounds.  Would keep the wounds dry, agree with the wound center dressing recommendations.   Thank you very much for involving me in Leonard [name]'s care. This is a low level infectious disease consult.   ____________________________ Rosalyn Gess. Lynae Pederson, MD meb:bjt D: 07/22/2011 13:43:31 ET T: 07/22/2011 13:56:13 ET JOB#: [id number]  cc: Rosalyn Gess. Rc Amison, MD, <Dictator> Daisey Caloca E Nike Southwell MD ELECTRONICALLY SIGNED 07/25/2011 10:39

## 2014-08-31 NOTE — Consult Note (Signed)
PATIENT NAME:  Leonard [name], Leonard [name] MR#:  [id number] DATE OF BIRTH:  [birth year]  DATE OF CONSULTATION:  08/15/2011  REFERRING PHYSICIAN:  Orson AloeMichael Blocker, MD CONSULTING PHYSICIAN:  A. Wendall MolaMelissa Solum, MD  CHIEF COMPLAINT: Hypotension.   HISTORY OF PRESENT ILLNESS: This is an 81 year old male who was admitted on 08/05/2011 from a nursing facility for placement of a urinary catheter. He has a complicated past medical history and had been at the rehab facility after hospitalization here last month for a urinary tract infection and SIRS. He has history of benign prostatic hypertrophy with urinary retention and on presentation for the catheter placement, blood pressure declined and has been in the 70-90/40 range consistently during the hospitalization. He is currently on Levophed. He has been intolerant of tapering off pressor support. He has been found to have a Klebsiella urinary tract infection which has been treated with an antibiotic. No clear cause of the hypotension has been found and sepsis is unlikely given prolonged of hypotension despite the antibiotics. His thyroid function test has been normal. Electrolytes have been fairly unremarkable. A morning cortisol at 6:30 a.m. on 08/07/2011 was 11. He was started on hydrocortisone 75 mg q. 8 hours on 08/09/2011, and despite this still has not had improvement in blood pressure. He was also started on Florinef on 08/12/2011, 0.2 mg daily, and due to concerns of possible autonomic neuropathy contributing to hypotension, midodrine 10 mg t.i.d. was added. He has a history of hypertension and prior to admission had been on beta blockers. He denies any prior known history of hypotension. The patient currently complains of some leg discomfort which is apparently improved with use of Percocet. He has no other complaints.   PAST MEDICAL HISTORY:  1. Cerebrovascular accident with residual left-sided paralysis.  2. Coronary artery disease and history of congestive heart  failure, status post bypass. 3. Benign prostatic hypertrophy with urinary retention.  4. Anemia of chronic disease.  5. Urinary tract infection with admission for SIRS 07/2011.  6. Degenerative disk disease.   PAST SURGICAL HISTORY:  1. Coronary artery bypass grafting.   2. Multiple back surgeries.   ALLERGIES: Penicillin and sulfa.    CURRENT INPATIENT MEDICATIONS:  1. Hydrocortisone 75 mg q. 8 hours.  2. Keflex 500 mg q. 8 hours.  3. Docusate 100 mg daily.  4. Florinef 0.2 mg daily.  5. Neurontin 300 mg b.i.d.  6. Midodrine 10 mg t.i.d.  7. Pantoprazole 40 mg daily.   8. Aspirin 81 mg daily.   SOCIAL HISTORY: The patient was residing at [facility] nursing facility prior to admission. He is a widower. He does not smoke or drink alcohol.   FAMILY HISTORY: Mother had osteoarthritis and father had Alzheimer's, per records.  REVIEW OF SYSTEMS:  HEENT: No blurred vision. No headache. NECK: No neck pain. No dysphasia. CARDIAC: No chest pain or palpitations. PULMONARY: No cough, no shortness of breath. ABDOMEN: No abdominal pain. Normal appetite. GU: He has a history of urinary retention, currently has a Foley in place. He denies dysuria. EXTREMITIES: He has leg swelling. He has chronic lower extremity discomfort. NEUROLOGIC: He has left-sided weakness due to his paralysis. No sensory deficits. HEME: Denies recent bleeding. He does report easy bruisability. SKIN: Chronic lower extremity wounds. No recent rash.   PHYSICAL EXAMINATION:  VITAL SIGNS: Blood pressure 70-81/33-39, pulse 50s, pulse oximetry 100% on room air. Respirations 28.   GENERAL: Well-developed white male in no distress.   HEENT: Extraocular movements are intact. No proptosis or  lid lag.  Oropharynx is clear. Mucous membranes moist.   NECK: Supple. No thyromegaly.   CARDIAC: Regular rate and rhythm. No audible murmur. No carotid bruit.   LUNGS: Clear to auscultation bilaterally. No wheeze.   ABDOMEN: Diffusely  soft, nontender. Positive bowel sounds.   EXTREMITIES: Edema is present. Leg wounds were not assessed.   NEURO: Left-sided weakness upper and lower extremities is present.   PSYCHIATRIC: Alert and oriented times three.  SKIN: No grossly visible rash.   LABORATORY DATA:  08/07/2011 at 6:30 a.m. cortisol 11. 08/08/2011 at midnight TSH 3.95. 08/11/2011 at 4:30 a.m. glucose 120, BUN 16, creatinine 0.47, sodium 145, potassium 4.1, chloride 109, CO2 28, calcium 8.9.   ASSESSMENT: This is an 81 year old male with multiple medical problems to include Klebsiella urinary tract infection and persistent hypotension, relatively new in onset and requiring pressor support. There is a question of whether he could have adrenal insufficiency.   PLAN: 1. Hypotension: Cause is certainly not clear. Morning cortisol of 11 in the setting of a severe illness is somewhat low. However, I feel adrenal insufficiency is unlikely given that he has been treated with hydrocortisone for the last six days without any significant improvement in blood pressure. 75 mg every eight hours is a sufficient dose that should have resolved the hypotension, if indeed he had adrenal insufficiency. Given that hypotension has been persistent, I recommend tapering down and off the hydrocortisone. A cosyntropin stimulation test may be helpful to definitively rule out AI, however this should not be done while he is already receiving hydrocortisone. Once the hydrocortisone is tapered off, could wait another 24 hours and then perform a cosyntropin stimulation test and I would be happy to help with that, if needed. Fludrocortisone has also been relatively unhelpful and a dose of 0.2 mg is reasonable if there was consideration of mineralocorticoid  deficiency, although it is not clear why he would have that problem and without adrenal insufficiency it seems very unlikely. No harm in continuing fludrocortisone, but I would follow his electrolytes to ensure  they are stable if continuing this medication.   Thank you for the kind request for consultation. I am available for questions if needed.  ____________________________ A. Wendall Mola, MD ams:bjt D:  08/15/2011 17:22:00 ET         T: 08/16/2011 09:25:31 ET         JOB#: [id number]  cc: A. Wendall Mola, MD, <Dictator> Macy Mis MD ELECTRONICALLY SIGNED 08/17/2011 16:38

## 2014-08-31 NOTE — Consult Note (Signed)
General Aspect 81 year old white male with past medical history of left-sided paralysis, bedbound, degenerative joint disease of the spine, hypertension, coronary artery disease, CABG per the notes,  who presents brought in  with right leg cellulitis,  fall on route, confusion. Cardiology was consulted for elevated cardiac enz and hypotension.   Patient is a poor historian. Notes indicate underlying dementia. Notes indicate he has a home health nurse who comes three times a week from [place]. They found cellulitis. When they were transporting, he fell and hit his head. he has been more confused. He has been bedbound, does not get out of bed. He had decreased appetite with a 20 to 30 pound weight loss in the last six months.   In the ER, the Patient was started on clindamycin, had head CT and labs drawn.    Present Illness . FAMILY HISTORY: Patient states his mother died of osteoarthritis and father died of Alzheimer's.   SOCIAL HISTORY: Does not smoke, drink, use illicit drugs. His wife passed away last year, one year ago in March. He has no children. His power of attorney are both Timmothy Sours and Enis Gash, phone # [phone].   Physical Exam:   GEN WD, WN, NAD    HEENT red conjunctivae    NECK supple    RESP normal resp effort    CARD Regular rate and rhythm  Murmur    Murmur Systolic    Systolic Murmur Out flow    ABD soft    EXTR negative edema    SKIN normal to palpation    NEURO decreased motion of the lower extremities, able to move his arms and hands    PSYCH alert, A+O to time, place, person, poor insight   Review of Systems:   Subjective/Chief Complaint "Cold", "can't move"    General: Weakness    Skin: No Complaints    ENT: No Complaints    Eyes: No Complaints    Neck: No Complaints    Respiratory: No Complaints    Cardiovascular: No Complaints    Gastrointestinal: No Complaints    Genitourinary: No Complaints    Vascular: No Complaints     Musculoskeletal: No Complaints    Neurologic: No Complaints    Hematologic: No Complaints    Psychiatric: No Complaints    Review of Systems: All other systems were reviewed and found to be negative    ROS poor historian    Medications/Allergies Reviewed Medications/Allergies reviewed     Multi-drug Resistant Organism (MDRO): Positive culture for MRSA., 26-Apr-2010   CHF:    Cervical spine surgery:    Back surgery x2:    CABG (Coronary Artery Bypass Graft):        Admit Diagnosis:   CELLULITIS ABCESS: 20-Jul-2011, Active, CELLULITIS ABCESS      Admit Reason:   Abscess or cellulitis of ankle: (682.6) Active, ICD9, Cellulitis and abscess of leg, except foot  Home Medications: Medication Instructions Status  acetaminophen-oxycodone 325 mg-5 mg oral tablet 1 tab(s) orally every 6 hours as needed. Active  gabapentin 300 mg oral capsule 1 cap(s) orally 2 times a day for legs. Active  carvedilol 3.125 mg oral tablet 1 tab(s) orally 2 times a day Active  zolpidem 10 mg oral tablet 1 tab(s) orally once a day (at bedtime) as needed. Active  diazepam 5 mg oral tablet 1 tab(s) orally 3 times a day Active     Blood Glucose:  13-Mar-13 01:52    POCT Blood Glucose 92  Routine Hem:  13-Mar-13 04:39    WBC (CBC) 11.6   RBC (CBC) 3.44   Hemoglobin (CBC) 10.9   Hematocrit (CBC) 33.1   Platelet Count (CBC) 143   MCV 96   MCH 31.9   MCHC 33.1   RDW 15.4   Neutrophil % 75.0   Lymphocyte % 13.2   Monocyte % 10.7   Eosinophil % 0.8   Basophil % 0.3   Neutrophil # 8.7   Lymphocyte # 1.5   Monocyte # 1.2   Eosinophil # 0.1   Basophil # 0.0  Routine Chem:  13-Mar-13 04:39    Glucose, Serum 110   BUN 12   Creatinine (comp) 0.55   Sodium, Serum 143   Potassium, Serum 3.4   Chloride, Serum 106   CO2, Serum 25   Calcium (Total), Serum 8.3   Anion Gap 12   Osmolality (calc) 285   eGFR (African American) >60   eGFR (Non-African American) >60  Cardiac:  13-Mar-13 04:39     Troponin I 0.09  Thyroid:  13-Mar-13 04:39    Thyroid Stimulating Hormone 0.287   EKG:   Interpretation EKG shows NSR with rate 64 bpm, no significant ST or T wave changes   Radiology Results: [place]:    12-Mar-13 16:22, [place] Color Flow Doppler Low Extrem Bilat   [place] Color Flow Doppler Low Extrem Bilat    REASON FOR EXAM:    edema  COMMENTS:       PROCEDURE: [place]  - US DOPPLER LOW EXTR BILATERAL  - Jul 19 2011  4:22PM     RESULT: The phasic, augmentation and Valsalva flow waveforms are normal   in appearance bilaterally. The femoral and popliteal veins bilaterally   show complete compressibility throughout their course. Bilateral Doppler   examination shows no occlusion or evidence for deep vein thrombosis.    IMPRESSION:     No deep vein thrombosis is identified on either side.    Thank you for theopportunity to contribute to the care of your patient.     Verified By: Dionne Ano WALL, M.D., MD    Penicillin: Hives  Sulfa drugs: N/V/Diarrhea  Vital Signs/Nurse's Notes: **Vital Signs.:   13-Mar-13 06:00   Vital Signs Type Routine   Temperature Temperature (F) 99.3   Celsius 37.3   Temperature Source oral   Pulse Pulse 62   Pulse source per Dinamap   Respirations Respirations 20   Systolic BP Systolic BP 423   Diastolic BP (mmHg) Diastolic BP (mmHg) 64   Mean BP 82   Pulse Ox % Pulse Ox % 97   Oxygen Delivery Room Air/ 21 %   Pulse Ox Heart Rate 64   Telemetry pattern Cardiac Rhythm Normal sinus rhythm; pattern reported by Telemetry Clerk     Impression 81 year old white male with past medical history of left-sided paralysis, bedbound, degenerative joint disease of the spine, hypertension, coronary artery disease, CABG per the notes,  who presents brought in  with right leg cellulitis,  fall on route, confusion.  elevated cardiac enz and hypotension.  A/P: 1) Hypotension Suspect secondary to infection from foot. Off pressors BP improved with abx and fluids. Appears  stable. Dod not suspect cardiac cause of hemodynamic instability  2) Bradycardia EKG noted with rate of 51 Will monitor rhythm.  Do not think this is contributing to low BP as most of the time heart rate is well above this rate.  3) Ulcers/cellulitis Sedentary. Clearly high risk of developing more  ulcers and cellulitis. on ABX Needs high level of care as unable to care for himself. Interestingly he can move his arms but can not cover himself with a blanket.  4) Elevated cardiac enz: Minimal elevation. No symptoms No further workup needed.   Electronic Signatures: Ida Rogue (MD)  (Signed 13-Mar-13 09:08)  Authored: General Aspect/Present Illness, History and Physical Exam, Review of System, Past Medical History, Health Issues, Home Medications, Labs, EKG , Radiology, Allergies, Vital Signs/Nurse's Notes, Impression/Plan   Last Updated: 13-Mar-13 09:08 by Ida Rogue (MD)

## 2014-08-31 NOTE — Consult Note (Signed)
PATIENT NAME:  Leonard [name], Leonard [name] MR#:  [id number] DATE OF BIRTH:  09-17-1929  DATE OF CONSULTATION:  07/20/2011  REFERRING PHYSICIAN:   CONSULTING PHYSICIAN:  Linus Galasodd Reigan Tolliver, DPM  REASON FOR CONSULTATION: This is a 81 year old male recently admitted after a fall. He has a history of dementia. He was noted to have some cellulitis and swelling in his right leg. He also complains of some painful toenails that are ingrown. He is unable to give much history on the leg. Does state that he has not walked in the last 2 to 3 years.   PAST MEDICAL HISTORY:  1. Partial left-sided paralysis, global weakness, bedbound.  2. Degenerative disease of the spine.  3. Congestive heart failure.  4. Hypertension.  5. Coronary artery disease.   PAST SURGICAL HISTORY:  1. Cervical spine surgery.  2. Back surgery.  3. Coronary artery bypass graft.   MEDICATIONS:  1. Coreg 3.125 mg b.i.d.  2. Percocet p.r.n.  3. K-Dur 10 mEq daily. 4. Lasix 40 mg daily. 5. Diazepam 5 mg t.i.d.  6. Avodart 0.5 mg daily. 7. Aspirin 81 mg daily.   ALLERGIES: Penicillin and sulfa.   FAMILY HISTORY: Alzheimer's disease, arthritis.   SOCIAL HISTORY: Denies alcohol or tobacco use. He is a widower. States he does still live at home.   REVIEW OF SYSTEMS: Some swelling and redness in the right leg. Also relates he has a sore on the back of his heel and leg. Unable to give much other history.   PHYSICAL EXAMINATION:   VASCULAR: DP and PT pulses are palpable. Capillary filling time appears intact.   NEUROLOGICAL: Appears to be some loss of sensation in the feet and digits.   INTEGUMENTARY: Skin is warm, dry, and atrophic with significant bilateral edema right greater than left. Absent hair growth. Toenails are thick, dystrophic, and discolored, brittle x10. There is an approximate 1 cm dry ulcerative area over the lateral malleolus on the right ankle. Also, a more extensive ulceration on the posterior leg which appears to be full  thickness. Some mild surrounding erythema but no frank infection.   MUSCULOSKELETAL: Very flaccid motion in the feet. The patient is unable to move the feet at all with muscle testing 0/5. Some pain on manipulation of the posterior aspect of the right leg.   LABORATORY, DIAGNOSTIC, AND RADIOLOGICAL DATA: Initial white count was in the normal range at 7.1 with no evidence of a left shift. Blood work this morning revealed an elevated white count at 11.6 with a shift in neutrophil number. Ultrasound right leg revealed no evidence of deep venous thrombosis.   IMPRESSION:  1. Ulceration with cellulitis, right leg.  2. Onychomycosis with onychocryptosis.   PLAN:  1. Debrided the nails.  2. At this point the posterior leg wound did not appear to be infected and would just recommend local wound care.   Thank you for allowing Podiatry to consult with this patient.   ____________________________ Linus Galasodd Laelia Angelo, DPM tc:drc D: 07/20/2011 13:28:31 ET T: 07/20/2011 15:51:21 ET JOB#: [id number] Rocco Kerkhoff DPM ELECTRONICALLY SIGNED 07/28/2011 12:02

## 2014-08-31 NOTE — Discharge Summary (Signed)
PATIENT NAME:  Leonard [name], Leonard [name] MR#:  [id number] DATE OF BIRTH:  1929/11/08  DATE OF ADMISSION:  07/19/2011 DATE OF DISCHARGE:  07/28/2011  ADDENDUM: This is an addendum to the interim discharge summary dictated by Dr. Hilda LiasVivek Sainani on 07/26/2011.  Please see interim summary dictated by Dr. Hilda LiasVivek Sainani on 07/26/2011.  FINAL DIAGNOSES:  1. Systemic inflammatory response syndrome. 2. Urinary tract infection. 3. Questionable cellulitis, lower extremity wounds. 4. Elevated cardiac enzymes. 5. Fall. 6. Failure to thrive. 7. Anemia. 8. Urinary retention. 9. Benign prostatic hypertrophy. 10. Paralysis. 11. Bed bound. 12. History of congestive heart failure, but no signs of congestive heart failure on this admission.  13. Coronary artery disease.  14. Hypertension.   DISCHARGE MEDICATIONS:   1. Percocet 5/325 mg one tablet every six hours as needed.  2. Gabapentin 300 mg 1 tablet twice a day.  3. Carvedilol 3.125 mg twice a day.  4. Flomax 0.4 mg p.o. nightly.  5. Colace 100 mg p.o. daily.  6. Ambien 5 mg p.o. nightly as needed for insomnia.  7. Plavix 75 mg p.o. daily.  8. Aspirin 81 mg p.o. daily.  9. Valium 5 mg p.o. three times a day as needed for anxiety or muscle spasm.  10. Doxycycline 100 mg p.o. twice a day through 08/01/2011 and then stop.   FOLEY CATHETER TREATMENT: Please leave Foley catheter in for one week while Flomax is working to shrink the prostate then try to remove. If unsuccessful may need urology consultation.   DRESSING CARE: Telfa dressing to wounds on the right lower extremity. DuoDERM for buttock. Heel protectors while in bed.   DIET: Low sodium. Must be fed with all meals.   ACTIVITY: As tolerated.   REFERRAL: [facility] and in 1 to 2 days with the doctor at rehab. Follow up with occupational therapy.   HOSPITAL COURSE: Please see interim discharge summary for course up to 07/26/2011. When I saw the patient on  07/27/2011, the patient was complaining of a little neck pain. Thyroid function was done, which was negative. Sonogram of the thyroid which was done showed a small nodular. On 07/28/2011, there was no pain on the neck. The patient was also having some lower abdominal pain. An ultrasound of the abdomen showed urinary retention, 1300 mL. A Foley catheter was placed. Flomax was started for benign prostatic hypertrophy. We also did see cholelithiasis without evidence of acute cholecystitis on the ultrasound of the abdomen. A comment on the patient's failure to thrive, he must be fed with all meals. I think this is secondary to being home alone and not eating. He will continue to need close clinical follow-up at the nursing home. The patient was also guaiac-negative on 07/27/2011. Last hemoglobin 10.0, last white count 5.5, last platelet count 233, and last creatinine 0.56.  TIME SPENT ON DISCHARGE: 40 minutes.  ____________________________ Herschell Dimesichard J. Renae GlossWieting, MD rjw:slb D: 07/28/2011 10:46:00 ET T: 07/28/2011 10:55:03 ET JOB#: [id number]  cc: Herschell Dimesichard J. Renae GlossWieting, MD, <Dictator> Salley ScarletICHARD J Namiyah Grantham MD ELECTRONICALLY SIGNED 07/29/2011 18:13

## 2014-08-31 NOTE — Discharge Summary (Signed)
PATIENT NAME:  Leonard [name], Leonard [name] MR#:  [id number] DATE OF BIRTH:  12/27/1929  DATE OF ADMISSION:  08/05/2011 DATE OF DISCHARGE:  08/19/2011   For additional details, please see the interim discharge summary done by Dr. Chales Abrahams on 08/12/2011.   DISCHARGE DIAGNOSES:  1. Klebsiella urinary tract infection.  2. Hypotension possibly due to autonomic dysfunction/adrenal insufficiency.  3. Sinus bradycardia.  4. Urinary retention with hematuria.  5. History of cerebrovascular accident with residual left hemiparesis.  6. Coronary artery disease status post CABG.  7. Neuropathy.  8. Anxiety.   DISPOSITION: The patient is being discharged to his skilled nursing facility.   DIET: Regular.   ACTIVITY: As tolerated.   FOLLOWUP: The patient is going to be discharged with his Foley. He needs BMP on 08/26/2011 and 08/31/2011, which has to be reported to his primary care physician to follow up on electrolytes.  Follow up with urologist Dr. Leonette Monarch in 1 to 2 weeks.   DISCHARGE MEDICATIONS:  1. Keflex 500 mg p.o. 3 times daily until 08/22/2011.  2. Protonix 40 mg daily.  3. Midodrine 10 mg  t.i.d.  4. Desmopressin 0.2 mg b.i.d.  5. Pseudoephedrine SA 120 mg b.i.d.  6. Florinef 0.3 mg daily.  7. Hydrocortisone 50 mg t.i.d.  8. Aspirin 81 mg daily.  9. Plavix 75 mg daily.  10. Neurontin 300 mg b.i.d.  11. Flomax 0.4 mg daily.  12. MiraLAX 17 grams daily p.r.n. constipation.  13. Ambien 5 mg at bedtime p.r.n. for insomnia.  14. Diazepam  3 times daily as needed for insomnia and muscle spasms.  15. Oxycodone 5 mg t.i.d. p.r.n. for pain. 16. Senna 50/8.6 mg, 2 tablets at bedtime.   RESULTS: The patient's CBC is stable. Hemoglobin is stable at 9.5. Labs with electrolytes normal.   HOSPITAL COURSE: The patient is an 81 year old male with past medical history of CVA with residual left-sided hemiparesis who presented with hypotension and difficulty placing a Foley. 1. Klebsiella urinary tract infection: The  patient was found to have Klebsiella urinary tract infection and infectious disease consultation with Dr. Leavy Cella was obtained. The patient is currently on treatment with Keflex which he will continue until 08/22/2011.  Initially the patient was thought to have an infiltrate on chest x-ray but the patient had no symptoms with normal white count and chest x-ray changes were more consistent with fibrotic changes. Infectious disease specialist Dr. Leavy Cella was in agreement. The patient's blood cultures have shown no growth so far.  Initially the patient was treated with Rocephin.  He is currently on oral Keflex.  2. Persistent hypotension: The patient was initially felt to have sepsis/hypertension due to urinary tract infection. He was in the Intensive Care Unit initially. However, he remained persistently hypotensive requiring pressors. Even though the patient's urinary tract infection was adequately treated, he continued to be hypotensive; therefore, cardiology consultation was obtained.  Dr. Mariah Milling felt that the patient's hypotension was due to possibly autonomic dysfunction versus adrenal insufficiency and less likely SIRS/sepsis. Cardiogenic shock was in the differential.  However, the patient had normal ejection fraction. The patient was started on Florinef, hydrocortisone, midodrine, vasopressin, and pseudoephedrine with significant improvement in his blood pressure. The patient was euvolemic and did not respond much to IV fluids. After initiation of all these medications the patient was able to be weaned off the vasopressors, moved out of the Intensive Care Unit, and monitored on the floor. The patient's blood pressure has remained normal. He is on a regular diet. He  should have followup BMP on 04/19 and 04/24 to follow up on his electrolytes.  3. Sinus bradycardia:  This was felt to be due to autonomic dysregulation. Cardiologist did not recommend a pacemaker at this time.  4. Urinary retention/hematuria:  This was felt to be due to Foley trauma and urinary tract infection. Urology was consulted who recommended discharging the patient with a Foley and follow up with urologist as an outpatient. Since the patient had hematuria initially, his Lovenox and Plavix were held but have been restarted at the time of discharge.   The rest of the patient's medical problems remained stable, and he is being discharged back to his facility in stable condition.   TIME SPENT: 45 minutes.    ____________________________ Darrick MeigsSangeeta Jaylyn Iyer, MD sp:bjt D: 08/19/2011 12:07:01 ET T: 08/19/2011 12:46:20 ET JOB#: [id number]  cc: Darrick MeigsSangeeta Tennis Mckinnon, MD, <Dictator> Darrick MeigsSANGEETA Takako Minckler MD ELECTRONICALLY SIGNED 08/19/2011 17:50

## 2014-08-31 NOTE — Consult Note (Signed)
PATIENT NAME:  Leonard ForesterCOOK, Khylen G MR#:  [id number] DATE OF BIRTH:  [birth year]  DATE OF CONSULTATION:  08/06/2011  REFERRING PHYSICIAN:   Dr. Margie EgePhichith  CONSULTING PHYSICIAN:  Tawni MillersEdward R. Weldon InchesHouser, II, MD  REASON FOR CONSULTATION:  Gross hematuria.  HISTORY OF PRESENT ILLNESS:  Mr. Leonard SimasCook is an 81 year old gentleman with a history of cerebrovascular accident and resultant left side partial paralysis who is fully anticoagulated at baseline secondary to congestive heart failure, coronary artery disease status post bypass, and high blood pressure.  He also has noticeable history of benign prostatic hypertrophy and urinary retention and presented from [facility] for catheter placement.  However, he dropped his pressure precipitously on admission and was admitted to the critical care unit.  Subsequently he had a Foley catheter placed and developed gross hematuria.  The patient reports baseline history of  difficulty urinating and urinary retention, although he is somewhat of a poor historian.  He denies any bladder pain.  PAST MEDICAL HISTORY:   1. Urinary tract infection. 2. Systemic inflammatory response syndrome. 3. Failure to thrive. 4. Anemia. 5. Benign prostatic hypertrophy. 6. Urinary retention. 7. Cerebrovascular accident. 8. Congestive heart failure. 9. Coronary artery disease. 10. Hypertension.  11. Dementia. 12. Neuropathy. 13. Anxiety.  PAST SURGICAL HISTORY: 1. Cervical spine history. 2. Back surgery. 3. Coronary artery bypass.  ALLERGIES:  Penicillin and sulfa.  MEDICATIONS:  Outpatient medications up to date and reviewed in chart.  FAMILY HISTORY:  Noncontributory.  SOCIAL HISTORY:  No tobacco or alcohol.  REVIEW OF SYSTEMS:  Weight loss and gross hematuria.  Balance of eleven systems otherwise  negative.   PHYSICAL EXAMINATION: VITAL SIGNS:  The patient is afebrile.  Currently on pressor support on Levophed.  GENERAL:  Alert and oriented times three. HerbalistConversation  interactive.  Elderly male.  HEENT:  Normocephalic, atraumatic.   NECK:  Supple.  No lymphadenopathy.  CARDIOVASCULAR:  Regular rate and rhythm.  LUNGS:  Clear anteriorly, nonlabored.  ABDOMEN:  Soft, nontender, nondistended.  GU:  Normal penis and bilateral descended testes with large caliber Foley in place with gross hematuria noted in the Foley.  EXTREMITIES:  5/5 strength in the upper extremities.  He has pressure boots in place.  NEURO:  Did not fully evaluate patient with known left side paralysis.  PSYCH:  Appropriate.  LABORATORY VALUES:   Current GFR greater than 60 with creatinine of 0.59.    ASSESSMENT:  81 year old with history of benign prostatic hypertrophy, retention, anticoagulation due to coronary artery disease, congestive heart failure, and cerebrovascular accident.  Suspect hematuria due to urinary tract infection, benign prostatic hypertrophy, or retention.  Foley draining pink-tinged urine after beginning with small clot burden.  I was able to irrigate a significant amount of blot burden from and through his Foley catheter with sterile saline, [place] irrigation.  After irrigating him his urine output was clear to very light pink-tinged.  PLAN:   1. Recommend continuing Foley catheter. 2. Irrigate as needed. 3. Avoid anticoagulation until urine remains clear. 4. Plan to change to three-way catheter. 5. Continuous bladder irrigation if urine output worsens. 6. Antibiotics.  Primary has consulted infectious disease. 7. Recommend Foley through discharge. 8. Follow with Dr. Leonette [name] for outpatient cystoscopy, voiding trial if appropriate.   ____________________________ Tawni MillersEdward R. Weldon InchesHouser, II, MD erh:bjt D: 08/06/2011 23:25:55 ET T: 08/07/2011 11:34:08 ET JOB#: [id number]  cc: Tawni MillersEdward R. Weldon InchesHouser, II, MD, <Dictator> Beaulah CorinEDWARD R Takila Kronberg MD ELECTRONICALLY SIGNED 08/15/2011 9:50

## 2014-08-31 NOTE — Consult Note (Signed)
Brief Consult Note: Diagnosis: Adjustment disorder with depressed mood.   Patient was seen by consultant.   Consult note dictated.   Recommend further assessment or treatment.   Orders entered.   Discussed with Attending MD.   Comments: Leonard [name] has no psychiatric history. He has multiple medical problems and has been bed bound fornthe past two years. He has been living by himself w/o adequate care. He has lost much weight.   MSE: Alert, oriented but insists on going home. He is absolutely unable to discuss any other options. He does not recognize that he no longer is safe at home.   PLAN: 1. The patient does not have the capacity to decide about his disposition.  Electronic Signatures: Kristine LineaPucilowska, Brae Schaafsma (MD)  (Signed 15-Mar-13 17:44)  Authored: Brief Consult Note   Last Updated: 15-Mar-13 17:44 by Kristine LineaPucilowska, Manu Rubey (MD)

## 2014-08-31 NOTE — Consult Note (Signed)
PATIENT NAME:  Leonard [name], Leonard [name] MR#:  [id number] DATE OF BIRTH:  [birth year]  DATE OF CONSULTATION:  07/22/2011  REFERRING PHYSICIAN:  Larena GlassmanAmir Firozvi, MD CONSULTING PHYSICIAN:  Jolanta B. Pucilowska, MD  REASON FOR CONSULTATION: Capacity evaluation.   IDENTIFYING DATA: Mr. Leonard [name] is a 81 year old male with no past psychiatric history.   CHIEF COMPLAINT:  "I'm going home."  HISTORY OF PRESENT ILLNESS: Mr. Leonard [name] who was admitted to the medical floor for possible cellulitis after a fall. He lives by himself, has home nurse coming to his house once a week to check on his legs. She felt that infection is worsening and the patient was diagnosed with Methicillin Resistant Staphylococcus Aureus. He has been bedbound for the past two years since his cerebrovascular accident. His wife passed away a year ago. The patient depends completely on help from friends and family. He does not eat, does not leave the house, is isolated and most of the time home alone. He started falling. He lost 17 pounds in the past few weeks. He, however, will not discuss placement. He believes that it is safe for him to return home. He reports that he has three meals a day and nurse coming to home every day and a niece who also in a nurse. In talking to Leonard [name], who is his health care power of attorney, it is obvious that the patient is confabulating.   PAST PSYCHIATRIC HISTORY: None. Some dementia is mentioned in his chart, but he receives no treatment. He is prescribed diazepam by Dr. Dossie Arbourrissman, his primary physician, not a good idea in patients with fall history.   FAMILY PSYCHIATRIC HISTORY: None.   PAST MEDICAL HISTORY:  1. Methicillin Resistant Staphylococcus Aureus infection.  2. Partial left-sided paralysis.  3. Bedbound. 4. Congestive heart failure. 5. Hypertension. 6. Coronary artery disease.   ALLERGIES: Penicillin and sulfa.  MEDICATIONS:  1. Coreg 3.125 mg twice daily.  2. Percocet as needed. 3. K-Dur 10 mg daily.   4. Lasix 40 mg daily.  5. Diazepam 5 mg 3 times daily.  6. Avodart 0.5 mg daily.  7. Aspirin 81 mg daily.   MEDICATIONS AT THE TIME OF CONSULTATION:  1. Aspirin 81 mg daily.  2. Coreg 3.125 mg twice daily.  3. Diazepam 5 mg q.8 hours as needed. 4. Lovenox. 5. Neurontin 300 mg twice daily.  6. Zofran as needed.  7. Ambien 5 mg as needed for sleep. 8. Plavix 75 mg daily. 9. Percocet 5 milligrams 1 tablet every 4-6 hours as needed for pain. 10. Colace 100 mg twice daily.  11. Vibramycin 100 mg q.12h.   SOCIAL HISTORY: He is widowed and he lives by himself. There are no children. His powers of attorney are Leonard [name] and Leonard [name].    REVIEW OF SYSTEMS: Difficult to obtain. The patient denies any problems except that he worries about Methicillin Resistant Staphylococcus Aureus infection in his leg and admits that he is bedbound. He sees it not as a problem at all. He feels that with proper pillows and a coffee table he is able to function at home well. He reportedly leaned too much and fell because of this coffee table.   PHYSICAL EXAMINATION:  VITAL SIGNS: Blood pressure 112/76, pulse 72, respirations 20, temperature 97.2.   GENERAL: This is a slender male in no acute distress. The rest of the physical examination is deferred to his primary attending.   LABORATORY, DIAGNOSTIC, AND RADIOLOGICAL DATA: Glucose 110, BUN 12, creatinine 0.55, sodium 143, potassium  3.4. Troponin 0.09. TSH 0.287. Free thyroxine 1.01. CBC: White blood count 11.6, hemoglobin 10.9, hematocrit 33.1, platelets 143. Urine culture positive for Klebsiella. Urinalysis is suggestive of urinary tract infection. EKG: Sinus rhythm with intermittent atrial and ventricular paced beats, possible left atrial enlargement, borderline EKG.   MENTAL STATUS EXAMINATION: The patient is alert and oriented to person, place, time, and somewhat to situation. He is pleasant, polite, and cooperative. He is engaging and tries to be funny, not  always his jokes are appropriate. He maintains good eye contact. His speech is of normal rhythm, rate, and volume. His mood is fine with full affect. Thought processing is logical and goal oriented, but the patient is absolutely unable to discuss his disposition. He demands to be returned to home and is unable to discuss any other options. He minimizes risks of being bedbound and makes up story to convince me that he has plenty of help around the house. In talking to his power of attorney, we know that this is not the case. DSS is involved. He is not suicidal or homicidal. There are no delusions or paranoia. There are no auditory or visual hallucinations. His cognition seems grossly intact. His insight and judgment are poor.   SUICIDE RISK ASSESSMENT: This is a patient with no past psychiatric history widowed for a year with worsening of his general health who refuses to accept the necessity of being cared for and supported.   DIAGNOSES:  AXIS I: Adjustment disorder with depressed mood.   AXIS II: Deferred.   AXIS III:  1. Hypertension.  2. History of cerebrovascular accident with paralysis, bedbound. 3. Coronary artery disease.  4. Hypertension. 5. Chronic pain   AXIS IV: Physical illness, loss of way of life.   AXIS V: GAF 40.   PLAN:  1. The patient does not have the capacity to make decisions, especially he is not able to make a decision about his disposition.  2. I concur with switching Valium to p.r.n. If he goes back home, it is inappropriate to treat him with Valium given his history of fall. If he goes to another facility, it can be determined by his future provider. We could offer him an antidepressant if necessary. I will follow-up.   ____________________________ Ellin Goodie. Jennet Maduro, MD jbp:ap D: 07/22/2011 17:58:38 ET T: 07/23/2011 10:32:11 ET JOB#: [id number]  cc: Jolanta B. Jennet Maduro, MD, <Dictator> Shari Prows MD ELECTRONICALLY SIGNED 07/24/2011 22:56

## 2014-08-31 NOTE — Consult Note (Signed)
Impression: 81yo WM w/ h/o CVA, dementia and recent Klebsiella UTI admitted with Klebsiella UTI and possible sepsis.  His blood cultures are negative so far.  His urine is again growing Klebsiella.  He was given doxycycline due to his PCN allergy during his last hospitalization.  He is tolerating carbapenems without problem.   Will change his antibiotics to cefazolin.  It is possible that he could have a cross reaction. If he develops rash, would stop the cefazolin. Would treat for 10-14 days.   Continue pressors for hypotension, as needed WBC has been normal.  Electronic Signatures: Lilah Mijangos, Rosalyn GessMichael E (MD)  (Signed on 01-Apr-13 15:38)  Authored  Last Updated: 01-Apr-13 15:38 by Abhishek Levesque, Rosalyn GessMichael E (MD)

## 2014-08-31 NOTE — H&P (Signed)
PATIENT NAME:  Leonard [name], Leonard [name] MR#:  [id number] DATE OF BIRTH:  07-Jun-1929  DATE OF ADMISSION:  07/19/2011  REFERRING PHYSICIAN: Dr. Shaune Pollack  PRIMARY CARE PHYSICIAN: Dr. Dossie Arbour  REASON FOR ADMISSION: Possible cellulitis, fall.   HISTORY OF PRESENT ILLNESS: This is a 81 year old white male with past medical history of left-sided paralysis, bedbound, degenerative joint disease of the spine, hypertension, coronary artery disease who presents brought in by his power of attorney with right leg cellulitis and fall. Patient is unable to give history as he has dementia. They were referred by home health nurse who comes three times a week from [facility] stated that right ankle looked to have cellulitis. Patient when they were transporting fell and hit his head. They said he has been more confused and has had failure to thrive. He has been bedbound, does not get out of bed. He had decreased appetite and supposedly had at least a 20 to 30 pound weight loss in the last six months. Patient was started on clindamycin, had head CT and labs drawn. We are asked to admit the patient for cellulitis, failure to thrive.   PAST MEDICAL HISTORY:  1. Partial left-sided paralysis, global weakness, bedbound. 2. Degenerative disease of the spine. 3. Congestive heart failure. 4. Hypertension. 5. Coronary artery disease.   PAST SURGICAL HISTORY:  1. Cervical spine surgery. 2. Back surgery. 3. Coronary artery bypass graft.   ALLERGIES: Penicillin and sulfa.   MEDICATIONS: Patient does not know his medications, we are trying to get these, but previously he was on:  1. Coreg 3.125 mg b.i.d.  2. Percocet p.r.n.  3. K-Dur 10 mEq daily.  4. Lasix 40 mg daily.  5. Diazepam 5 mg t.i.d.  6. Avodart 0.5 mg daily.  7. Aspirin 81 mg daily.   FAMILY HISTORY: Patient states his mother died of osteoarthritis and father died of Alzheimer's.   SOCIAL HISTORY: Does not smoke, drink, use illicit drugs. His wife passed away last  year, one year ago in March. He has no children. His power of attorney are both Roe Coombs and Ermalene Searing, phone # [phone].   REVIEW OF SYSTEMS: Unobtainable secondary to patient's dementia.   PHYSICAL EXAMINATION:  VITAL SIGNS: Temperature 98.9, heart rate 84, respirations 16, blood pressure 105/61, sating 98% on room air.  GENERAL: Patient is well developed, he is cachectic with dry mucous membranes.   HEENT: Pupils equal, reactive to light and accommodation. He has difficulty hearing. Oropharynx clear.   NECK: No JVD. No thyromegaly. No lymphadenopathy.   LUNGS: Clear to auscultation. No use of accessory muscles or increased effort. Resonant to percussion.   CARDIOVASCULAR: Regular rate and rhythm. Normal S1, S2. No murmurs, gallops, or rubs appreciated. There is 1+ to 2+ lower extremity edema bilaterally. PMI is not lateralized.  BREASTS: No obvious masses.   ABDOMEN: Soft, nontender, nondistended. Positive bowel sounds.   GENITOURINARY: Deferred.   MUSCULOSKELETAL: Strength 5/5.   SKIN: No rashes, lesions, induration. He has no decubitus ulcers.   LYMPH: No lymphadenopathy in cervical area or supraclavicular.   NEUROLOGIC: Cranial nerves II through XII are intact. Patient follows commands. Sensation is intact. He has no dysarthria, aphasia, dysphagia.   PSYCH: Alert and oriented x1 to 2. He is confused and poor judgment.   LABORATORY, DIAGNOSTIC AND RADIOLOGICAL DATA: CT of cervical spine: No evidence of acute cervical spine fracture or dislocation. Right ankle showed no significant osseous abnormalities identified. Glucose 108, BUN 13, creatinine 0.50, sodium 130, potassium 3.7, chloride 102,  bicarbonate 24, anion gap 12, white count 7.1, hemoglobin 11.9, hematocrit 35.5, platelets 163. Right ankle x-ray shows no osseous abnormalities.   ASSESSMENT AND PLAN: This is a 81 year old white male with past medical history of dementia, paralysis, history as degenerative disc disease  presents with cellulitis and fall. 1. Cellulitis with edema. Will check Doppler's as patient is bedbound. Could have deep venous thrombosis. Will continue clindamycin and await for blood culture results.  2. Fall. Cervical spine CT showed no significant abnormalities but family stated patient hit his head so will order head CT. Will continue with neuro checks. Patient is paralyzed and bedbound so may need to get palliative care involved. 3. History of known congestive heart failure. Patient looks dehydrated. Will give gentle IV fluid hydration and monitor. He has low blood pressure at this point, not able to tolerate Lasix or even his Coreg.  4. Left-sided partial paralysis for last five years. Patient is wheelchair bound. Consult palliative care.  5. Failure to thrive, weight loss. Will check prealbumin, TSH. Will get speech and language pathology evaluation.  6. Hyperglycemia. This is most likely stress reaction and will monitor.  7. Anemia, most likely anemia of chronic disease. Will send off Hemoccult. 8. Deep vein thrombosis prophylaxis. Maintain with Lovenox and aspirin.  9. CODE STATUS: FULL CODE.     TOTAL TIME SPENT ON ADMISSION: 55 minutes.   ____________________________ Corie ChiquitoAmir A. Lafayette DragonFirozvi, MD aaf:cms D: 07/19/2011 15:59:33 ET T: 07/19/2011 16:41:54 ET JOB#: [id number]  cc: Karolee OhsAmir A. Lafayette DragonFirozvi, MD, <Dictator> Steele SizerMark A. Crissman, MD Coralyn PearAMIR A Malajah Oceguera MD ELECTRONICALLY SIGNED 07/19/2011 20:47

## 2014-08-31 NOTE — Consult Note (Signed)
PATIENT NAME:  Leonard [name], Leonard [name] MR#:  [id number] DATE OF BIRTH:  26-Jul-1929  DATE OF CONSULTATION:  08/08/2011  REFERRING PHYSICIAN:  Fredia Sorrow, MD CONSULTING PHYSICIAN:  Rosalyn Gess. Shawntell Dixson, MD  REASON FOR CONSULTATION: Possible sepsis.   HISTORY OF PRESENT ILLNESS: The patient is an 81 year old white man with a past history significant for prior stroke, dementia, and recent hospitalization for Klebsiella urinary tract infection who was admitted on 08/05/2011 with urinary retention and hypotension. The patient apparently had several attempts at passing a Foley catheter that was unsuccessful and was sent to the emergency room for Foley catheterization. He was also noted to be hypotensive. He was admitted to the Critical Care Unit and started on pressors. He was also started on broad antibiotic coverage including vancomycin and meropenem. Blood cultures from admission have been negative, but urine cultures are growing Klebsiella pneumoniae, which was present on his prior hospitalization. During his prior hospitalization, he was discharged on doxycycline due to his prior penicillin allergy. He was still taking this at the time of admission. The patient denies any current fevers, chills, or sweats. He has not had any nausea or vomiting. He has had no dysuria, but has had some fullness in the bladder. His white count on admission was normal.   ALLERGIES: Penicillin and sulfa drugs. He states that the penicillin allergy was a rash which occurred when he was 81 years old. He does not have much recollection of the events around that episode.  PAST MEDICAL HISTORY:  1. Recent hospitalization for Klebsiella urinary tract infection earlier last month. He was discharged on doxycycline.  2. Dementia.  3. Left-sided paralysis with global weakness related to a prior CVA.  4. Degenerative spine disease.  5. Congestive heart failure.  6. Hypertension.  7. Coronary artery disease status post coronary artery bypass  graft.   SOCIAL HISTORY: The patient previously lived at home, but he has now been in a skilled nursing facility since his last hospitalization. He does not smoke. He does not drink.   FAMILY HISTORY: Positive for osteoarthritis and Alzheimer's dementia.   REVIEW OF SYSTEMS: GENERAL: No fevers, chills, or sweats. No malaise. HEENT: No specific complaints. RESPIRATORY: No cough or shortness of breath. No sputum production. CARDIAC: No chest pain or palpitations. GI: No nausea, no vomiting, no abdominal pain, and no change in his bowels. GU: He has had some discomfort in his abdomen, but no frank dysuria. No flank pain. SKIN: He has had multiple ecchymoses over the arms especially and he has had some rash in the left upper extremity around the site of his IV, but no other rashes. NEUROLOGIC: He has some residual effects of stroke, but no new findings. All other review of systems are negative.   PHYSICAL EXAMINATION:   VITAL SIGNS: T-max 98.6, pulse 60, blood pressure 80/52 on pressors, and saturation 97% on room air.   GENERAL: An 81 year old white man in no acute distress.   HEENT: Normocephalic, atraumatic. Pupils equal and reactive to light. Extraocular motion intact. Sclerae, conjunctivae, and lids are without evidence for emboli or petechiae. Teeth and lips are within normal limits.   NECK: Midline trachea. No lymphadenopathy. No thyromegaly.   LUNGS: Clear to auscultation bilaterally with good air movement. No focal consolidation.   HEART: Regular rate and rhythm without murmur, rub, or gallop. No peripheral edema.   ABDOMEN: Soft, nontender, and nondistended. No hepatosplenomegaly or hernia is noted.   EXTREMITIES: No evidence for tenosynovitis.   SKIN: He had some  erythema on the left forearm around the IV site. This was slightly warm to touch. The area was somewhat blanching and mildly tender.   NEUROLOGIC: The patient was awake and interactive. He had some residual effects of his  CVA but no dense focal weakness.   PSYCHIATRIC: Mood and affect appeared normal. He was mildly confused.   IMPRESSION: A 81 year old white man with history of stroke, dementia, and recent Klebsiella urinary tract infection who was admitted with Klebsiella UTI and possible sepsis.   RECOMMENDATIONS:  1. His blood cultures are negative so far. His urine is again growing Klebsiella. He was given doxycycline due to his penicillin allergy during his last hospitalization. He is tolerating carbapenems without problem.  2. We will change his antibiotic to cefazolin. It is possible that he could have a cross reaction. If he develops a rash, I would stop the cefazolin and give him ertapenem. We will plan on treating him for 10 to 14 days.  3. We will continue pressors for hypotension as needed.  4. His white count has been normal.   Thank you very much for involving me in Mr. Zenon's care.   This is a moderately complex infectious disease case.  ____________________________ Rosalyn GessMichael E. Naraly Fritcher, MD meb:slb D: 08/08/2011 15:44:29 ET T: 08/08/2011 17:26:35 ET JOB#: [id number]  cc: Rosalyn GessMichael E. Madalynne Gutmann, MD, <Dictator> Salah Burlison E Sterling Mondo MD ELECTRONICALLY SIGNED 08/11/2011 13:16

## 2014-08-31 NOTE — Consult Note (Signed)
PATIENT NAME:  Leonard [name], Leonard [name] MR#:  [id number] DATE OF BIRTH:  11-10-1929  DATE OF CONSULTATION:  08/11/2011  REFERRING PHYSICIAN:  Dr. Chales AbrahamsGupta  CONSULTING PHYSICIAN:  Jerolyn CenterMuhammad A. Kirke CorinArida, MD  PRIMARY CARDIOLOGIST: Dr. Mariah MillingGollan   REASON FOR CONSULTATION: Persistent hypotension and bradycardia.   HISTORY OF PRESENT ILLNESS: This is an 81 year old gentleman with history of left-sided paralysis, bedbound, coronary artery disease status post remote coronary artery bypass graft surgery as well as history of dementia. The patient was admitted on 03/29 with hypotension thought to be due to urinary tract infection and pneumonia. The patient has been treated with antibiotics and it seems that his infection has cleared. Nonetheless, he persisted to be hypotensive. He was initially on Levophed. He was switched to dopamine to improve his heart rate. He is still on 10 mcg/kg per minute of dopamine. His heart rate is running in the 50s and 60s. He had his thyroid function checked which was normal. He was checked for adrenal insufficiency as well and that was unremarkable. He does not appear to be volume depleted. He denies any chest pain or dyspnea. He does complain of dizziness. He used to be on Coreg in the past which was a small dose and this was discontinued. He was started on hydrocortisone stress dosing but he is still requiring dopamine to maintain his blood pressure. He was admitted recently early this month with failure to thrive and reported weight loss over the last few months with urinary retention. His cardiac enzymes were elevated at that time slightly. His cardiac enzymes have been negative during this admission.   PAST MEDICAL HISTORY:  1. Failure to thrive.  2. Anemia of chronic disease.  3. Benign prostatic hypertrophy.  4. Coronary artery disease status post coronary artery bypass graft surgery.  5. History of congestive heart failure with unknown ejection fraction.  6. History of cerebrovascular  accident with partial left-sided paralysis.  7. History of hypertension.  8. Dementia.  9. Neuropathy.  10. Anxiety.  11. Degenerative disk disease.   ALLERGIES: Penicillin and sulfa.   FAMILY HISTORY: Family history is negative for premature coronary artery disease.   SOCIAL HISTORY: Negative for smoking, alcohol or recreational drug use.   CURRENT MEDICATIONS:  1. Aspirin 81 mg daily.  2. Keflex 500 mg every eight hours.  3. Colace.  4. Neurontin 300 mg twice daily.  5. Hydrocortisone 75 mg IV every eight hours.  6. Protonix 40 mg once a day.  7. Flomax 0.4 mg once daily.  8. Dopamine 10 mcg/kg per minute.  9. Diazepam 5 mg every eight hours as needed.  10. Zofran 4 mg every six hours as needed.  11. Oxycodone 5 mg every 4 to 6 hours as needed.   REVIEW OF SYSTEMS: Unable to obtain at this time.   PHYSICAL EXAMINATION:  GENERAL: The patient is a frail 81 year old gentleman who is in no acute distress.   VITAL SIGNS: Heart rate is running in the 50s but goes up quickly to 65 with movement in bed. Blood pressure 85/50, respiratory rate 20, oxygen saturation 97% on room air.   HEENT: Normocephalic, atraumatic.   NECK: No jugular venous distention or carotid bruits.   RESPIRATORY: Normal respiratory effort with no use of accessory muscles. Auscultation reveals normal breath sounds.   CARDIOVASCULAR: Normal PMI. Normal S1 and S2 with no gallops or murmurs.   ABDOMEN: Benign, nontender, nondistended.   EXTREMITIES: No clubbing, cyanosis, or edema.   SKIN: Warm and dry with  no rash.   PSYCHIATRIC: The patient does not seem to be oriented.   LABORATORY, DIAGNOSTIC AND RADIOLOGICAL DATA: ECG showed sinus bradycardia with a heart rate of 47 beats per minute. Normal QT interval. No significant ST or T wave changes. His cardiac enzymes are negative. His labs show a creatinine of 0.47 with BUN of 16. His hemoglobin is 11.1 and white cell count is 7.4. His albumin was low at 2.6.  TSH was within normal limits.   IMPRESSION:  1. Hypotension of unclear etiology.  2. Sinus bradycardia, which is likely contributing to #1.  3. History of coronary artery disease status post coronary artery bypass graft surgery.  4. History of congestive heart failure with unknown ejection fraction.  5. Failure to thrive.   RECOMMENDATIONS: The patient has hypotension which was initially thought to be due to sepsis. His sepsis has been effectively treated but nonetheless he remains hypotensive requiring dopamine. There is really nothing in his labs to explain this hypotension. There is no evidence of volume depletion. He is not significantly anemic. I suspect that some of his hypotension might be due to his overall failure to thrive and poor nutritional status. I still think adrenal insufficiency might still be a possibility in spite of negative stem test. I recommend continuing hydrocortisone. I will go ahead and add a small dose fludrocortisone 0.1 mg once daily which can be increased to 0.2 mg once daily. An echocardiogram was ordered and will be reviewed. Will have to check his LV systolic function and ensure that there is no other reasons to explain his hypotension such as pericardial effusion which I doubt. If his ejection fraction is significantly low, we can consider adding small dose digoxin to improve contractility. His bradycardia is definitely inappropriate considering the degree of hypotension that he is in. His thyroid function was checked and it was normal. Although a pacemaker might be helpful in improving his heart rate, I do not think this will add much benefit considering the risk of infection that he will get if he gets a device like that. We will continue to follow the patient with you.       We might consider adding Midodrine as well at some point if he continues to be hypotensive.   ____________________________ Chelsea Aus. Kirke Corin, MD maa:cms D: 08/11/2011 09:47:03  ET T: 08/11/2011 10:30:53 ET JOB#: [id number]  cc: Muhammad A. Kirke Corin, MD, <Dictator> Antonieta Iba, MD Jerolyn Center Argentina Donovan MD ELECTRONICALLY SIGNED 08/27/2011 14:06

## 2014-08-31 NOTE — H&P (Signed)
PATIENT NAME:  Leonard [name], Leonard [name] MR#:  [id number] DATE OF BIRTH:  Dec 18, 1929  DATE OF ADMISSION:  08/05/2011  REFERRING PHYSICIAN: Dr. Margarita [name]   PRIMARY PHYSICIAN: Dr. Dossie [name], currently being followed at [facility] by Dr. Terance [name]    PRESENTING COMPLAINT: Sent over from [facility] for catheter placement secondary to urinary retention and found to be persistently hypotensive.   HISTORY OF PRESENT ILLNESS: Leonard [name] is an 81 year old gentleman with history of CVA and resultant left side partial paralysis and bedbound, congestive heart failure, adult failure to thrive, benign prostatic hypertrophy, urinary retention, history of coronary artery disease status post bypass, and hypertension. The patient himself is a poor historian. He apparently was sent over from [facility] for catheter placement as the rehab facility had difficulty placing a new catheter. He initially presented hypertensive but had resultant drop in his pressures, repeat blood pressure 82/42. His blood pressure in general has been ranging between 80's to 90's over 40's to 60's. The patient denies any chest pain. Reports that his shortness of breath is at baseline. No presyncope or syncope. No palpitations. He endorses pain in his bilateral knees but also endorses weight loss over 100 pounds in the past two months.   PAST MEDICAL HISTORY:  1. Admitted 03/12 to 07/28/2011 status post fall for management of urinary tract infection and systemic inflammatory response syndrome with bilateral lower extremity wounds. He was also noted to have elevated cardiac enzymes.  2. Adult failure to thrive. It was felt that his significant weight loss was related to this with poor p.o. intake and adjustment disorder with the passing of his wife one year ago.  3. Anemia of chronic disease.  4. Benign prostatic hypertrophy.  5. Urinary retention.  6. History of cerebrovascular accident with partial left-sided paralysis and bedbound.   7. Congestive heart failure, unspecified.  8. Coronary artery disease, status post bypass.  9. Hypertension.  10. Found to have right thyroid gland nodule on last admission.  11. Dementia. 12. Neuropathy.  13. Anxiety.  14. Degenerative disk disease.   PAST SURGICAL HISTORY:  1. Cervical spine surgery.  2. Back surgery.  3. Bypass.   ALLERGIES: Penicillin and sulfa.   MEDICATIONS:  1. Aspirin 81 mg daily.  2. Colace 100 mg daily.  3. Plavix 75 mg daily.  4. Carvedilol 3.125 mg b.i.d.  5. Doxycycline 100 mg b.i.d.  6. Gabapentin 300 mg b.i.d.  7. Flomax 0.4 mg daily at bedtime.  8. MiraLAX 17 grams daily, hold for loose stools.  9. Ambien 5 mg at bedtime as needed.  10. Percocet 5/325 one tablet every six hours as needed.  11. Valium 5 mg 3 times daily as needed.  12. Oxycodone 5 mg every three hours as needed.  13. Senna Plus S 2 tablets at bedtime.   FAMILY HISTORY: Mother with osteoarthritis; father Alzheimer's.   SOCIAL HISTORY: No tobacco, alcohol, or drug use. Wife deceased 07-22-10. No children. His friends are his power-of-attorney which is Leonard [name] and Leonard [name], phone number [phone].   REVIEW OF SYSTEMS: Review of systems is limited due to the patient's dementia. CONSTITUTIONAL: He endorses weight loss as per history of present illness. EYES: No glaucoma. ENT: Denies any epistaxis or discharge. RESPIRATORY: No cough, wheezing. He endorses baseline shortness of breath. CARDIOVASCULAR: No chest pain, syncope. GI: No nausea, vomiting, diarrhea, abdominal pain, hematemesis, or melena. GU: As per history of present illness. ENDOCRINE: No polyuria or polydipsia. HEME: No easy bleeding. SKIN: He has  lower extremity wound. MUSCULOSKELETAL: Reports bilateral knee pain and spasm. NEUROLOGIC: History of stroke. PSYCH: Denies any suicidal ideation.   PHYSICAL EXAMINATION:   VITAL SIGNS: Temperature 98.1, pulse 88, respiratory rate 16, blood pressure initially 208/160,  repeat pressures between 80's to 90's over 40's to 60's, sating at 98% on room air.   GENERAL: Lying in bed in no apparent distress.   HEENT: Normocephalic, atraumatic. Pupils equal, symmetric, nonicteric. Moist mucous membrane.   NECK: Soft and supple. No adenopathy or JVP.   CARDIOVASCULAR: Non-tachy. No murmurs, rubs, or gallops.   LUNGS: Faint basilar crackles. No use of accessory muscles or increased respiratory effort.   ABDOMEN: Soft. Positive bowel sounds. No mass appreciated.   GU: Foley in place with gross hematuria noted in the Foley.   EXTREMITIES: Pressure boots in place. He has a bandage to the right lower extremity. No open ulcers on the left lower extremity. He has trace edema.    NEUROLOGIC: The patient is with left side paralysis.   PSYCH: He is alert with baseline dementia.   PERTINENT LABS AND STUDIES: Chest x-ray with right upper lobe pneumonia and minimal right lung base involvement. WBC 7.5, hemoglobin 10.6, hematocrit 32.2, platelets 316, MCV 96, glucose 94, BUN 24, creatinine 0.68, sodium 140, potassium 4.1, chloride 105, carbon dioxide 28, calcium 8.7, total bilirubin 0.3, alkaline phosphatase 57, ALT 14, AST 16, total protein 7.1, albumin 2.5. Urinalysis with specific gravity of 1.019, blood 3+, pH 5, protein 30 mg/dL, nitrite positive, trace leukocyte esterase, RBCs 3320 per high-power field, WBCs 54 per high-power field, 3+ bacteria EKG read as atrial fibrillation but appears to be sinus. No ST changes.   ASSESSMENT AND PLAN: Mr. Leonard [name] is an 81 year old gentleman with history of coronary artery disease status post bypass, hypertension, adult failure to thrive, anxiety, dementia, CVA with left-sided partial paralysis and bedbound, benign prostatic hypertrophy, and urinary retention who presents from [facility] with urinary retention and found to be persistently hypotensive.  1. Sepsis, likely combined urinary tract infection and pneumonia. Given recent  admission, will cover for hospital-acquired pneumonia with vancomycin. Will cover with imipenem for Klebsiella urinary tract infection from last admission that was sensitive. He is allergic to penicillin and sulfa. Continue with his wound care of the right lower extremity. Continue pressure boots. Blood cultures and urine cultures are pending. Continue IV fluids and follow with his history of CHF. Hold Coreg with his persistent hypotension. Monitor his blood pressure closely. Will hold off on pressors for now. Continue Foley. He has noted gross hematuria. Will obtain Urology consultation. Obtain ID consultation to help with antibiotic recommendations.  2. Coronary artery disease status post bypass. Will hold Coreg. Restart aspirin and Plavix.  3. Hypertension, as above holding Coreg. 4. Lower extremity wound. Continue wound care, frequent turns, and pressure boots. 5. Anemia, stable.   6. Prophylaxis with Lovenox, aspirin, Plavix, and Protonix.   TIME SPENT: Approximately 55 minutes spent on patient care.   ____________________________ Reuel DerbyAlounthith Hasson Gaspard, MD ap:drc D: 08/05/2011 23:22:05 ET T: 08/06/2011 08:56:06 ET JOB#: [id number]  cc: Pearlean BrownieAlounthith Jenalee Trevizo, MD, <Dictator> Teena Iraniavid M. Leonard HartBronstein, MD Reuel DerbyALOUNTHITH Lamonda Noxon MD ELECTRONICALLY SIGNED 09/04/2011 1:55

## 2014-08-31 NOTE — Consult Note (Signed)
Brief Consult Note:  see full dict note for Gross hematuria 82 with hx of BPH, Retention, and Anticoagulation due to CAD/CHF/CVA.  Suspect hemaamturia due to UTI, BPH, Retention.  Foley draining pink tinged after irrigating small clot burden.      - Cont. current foley  - irrigate prn  - hold anitcoags until remains clear  - will change to 3-way/CBI if worsens  - ABX per primary/ID  - recc. foley through DC  - f/u Dr. Orson SlickHarmon for outpt cysto/voiding trial if appropriate   Electronic Signatures: Greene Diodato, Shan LevansEdward Ross (MD) (Signed on 30-Mar-13 11:53)  Authored   Last Updated: 30-Mar-13 23:27 by Charyl BiggerHouser, Abdulmalik Darco Ross (MD)
